# Patient Record
Sex: Female | Born: 1980 | Race: White | Hispanic: No | Marital: Married | Smoking: Former smoker
Health system: Southern US, Community
[De-identification: ages and names within clinical notes are randomized; demographics above are authoritative.]

## PROBLEM LIST (undated history)

## (undated) DIAGNOSIS — F419 Anxiety disorder, unspecified: Secondary | ICD-10-CM

## (undated) DIAGNOSIS — R51 Headache: Secondary | ICD-10-CM

## (undated) DIAGNOSIS — R519 Headache, unspecified: Secondary | ICD-10-CM

## (undated) DIAGNOSIS — F909 Attention-deficit hyperactivity disorder, unspecified type: Secondary | ICD-10-CM

## (undated) DIAGNOSIS — M549 Dorsalgia, unspecified: Secondary | ICD-10-CM

## (undated) DIAGNOSIS — G8929 Other chronic pain: Secondary | ICD-10-CM

## (undated) DIAGNOSIS — M542 Cervicalgia: Secondary | ICD-10-CM

## (undated) HISTORY — PX: TUBAL LIGATION: SHX77

---

## 2008-01-26 ENCOUNTER — Emergency Department (HOSPITAL_COMMUNITY): Admission: EM | Admit: 2008-01-26 | Discharge: 2008-01-26 | Payer: Self-pay | Admitting: Emergency Medicine

## 2011-06-06 ENCOUNTER — Emergency Department (HOSPITAL_COMMUNITY)
Admission: EM | Admit: 2011-06-06 | Discharge: 2011-06-06 | Disposition: A | Discharge status: Home / Self Care | Payer: Medicaid Other | Attending: Emergency Medicine | Admitting: Emergency Medicine

## 2011-06-06 ENCOUNTER — Encounter (HOSPITAL_COMMUNITY): Payer: Self-pay | Admitting: *Deleted

## 2011-06-06 DIAGNOSIS — N390 Urinary tract infection, site not specified: Secondary | ICD-10-CM | POA: Insufficient documentation

## 2011-06-06 DIAGNOSIS — Z9851 Tubal ligation status: Secondary | ICD-10-CM | POA: Insufficient documentation

## 2011-06-06 DIAGNOSIS — F17200 Nicotine dependence, unspecified, uncomplicated: Secondary | ICD-10-CM | POA: Insufficient documentation

## 2011-06-06 LAB — URINE MICROSCOPIC-ADD ON

## 2011-06-06 LAB — URINALYSIS, ROUTINE W REFLEX MICROSCOPIC
Ketones, ur: NEGATIVE mg/dL
Leukocytes, UA: NEGATIVE
Nitrite: NEGATIVE
Protein, ur: NEGATIVE mg/dL
pH: 6 (ref 5.0–8.0)

## 2011-06-06 MED ORDER — NITROFURANTOIN MONOHYD MACRO 100 MG PO CAPS: 100.0000 mg | ORAL_CAPSULE | Freq: Two times a day (BID) | ORAL | Status: AC

## 2011-06-06 MED ORDER — PHENAZOPYRIDINE HCL 100 MG PO TABS
200.0000 mg | ORAL_TABLET | Freq: Once | ORAL | Status: AC
  Administered 2011-06-06: 100 mg via ORAL
  Filled 2011-06-06: qty 1

## 2011-06-06 MED ORDER — NITROFURANTOIN MONOHYD MACRO 100 MG PO CAPS
100.0000 mg | ORAL_CAPSULE | Freq: Once | ORAL | Status: AC
  Administered 2011-06-06: 100 mg via ORAL
  Filled 2011-06-06: qty 1

## 2011-06-06 MED ORDER — PHENAZOPYRIDINE HCL 100 MG PO TABS
ORAL_TABLET | ORAL | Status: AC
  Filled 2011-06-06: qty 1

## 2011-06-06 MED ORDER — PHENAZOPYRIDINE HCL 200 MG PO TABS: 200.0000 mg | ORAL_TABLET | Freq: Three times a day (TID) | ORAL | Status: AC

## 2011-06-06 NOTE — ED Notes (Signed)
Pt c/o burning and pain on urination since last night. Pt also c/o blood when she wipes and urgency.

## 2011-06-06 NOTE — ED Provider Notes (Signed)
History     CSN: [id number]  Arrival date & time 06/06/11  1304   First MD Initiated Contact with Patient 06/06/11 1337      Chief Complaint  Patient presents with  . Urinary Frequency    (Consider location/radiation/quality/duration/timing/severity/associated sxs/prior treatment) Patient is a 31 y.o. female presenting with frequency. The history is provided by the patient. No language interpreter was used.  Urinary Frequency This is a new problem. The current episode started yesterday. The problem occurs constantly. The problem has been gradually worsening. Associated symptoms include urinary symptoms. Pertinent negatives include no chills, diaphoresis, fever, myalgias, nausea or vomiting.    History reviewed. No pertinent past medical history.  Past Surgical History  Procedure Date  . Tubal ligation     History reviewed. No pertinent family history.  History  Substance Use Topics  . Smoking status: Current Everyday Smoker    Types: Cigarettes  . Smokeless tobacco: Not on file  . Alcohol Use: No    OB History    Grav Para Term Preterm Abortions TAB SAB Ect Mult Living                  Review of Systems  Constitutional: Negative for fever, chills and diaphoresis.  Gastrointestinal: Negative for nausea and vomiting.  Genitourinary: Positive for dysuria, urgency, frequency and hematuria.  Musculoskeletal: Negative for myalgias and back pain.  All other systems reviewed and are negative.    Allergies  Ultram  Home Medications   Current Outpatient Rx  Name Route Sig Dispense Refill  . ACETAMINOPHEN 500 MG PO TABS Oral Take 1,000 mg by mouth every 6 (six) hours as needed. For pain    . ALPRAZOLAM 1 MG PO TABS Oral Take 1 mg by mouth 2 (two) times daily as needed. For anxiety    . IBUPROFEN 200 MG PO TABS Oral Take 800 mg by mouth every 6 (six) hours as needed. For pain    . OXYCODONE HCL 15 MG PO TABS Oral Take 15 mg by mouth every 6 (six) hours as needed. For  pain      BP 111/69  Pulse 57  Temp(Src) 98.1 F (36.7 C) (Oral)  Resp 18  Ht 5\' 11"  (1.803 m)  Wt 190 lb (86.183 kg)  BMI 26.50 kg/m2  SpO2 100%  LMP 05/29/2011  Physical Exam  Nursing note and vitals reviewed. Constitutional: She is oriented to person, place, and time. She appears well-developed and well-nourished. No distress.  HENT:  Head: Normocephalic and atraumatic.  Eyes: EOM are normal.  Neck: Normal range of motion.  Cardiovascular: Normal rate, regular rhythm and normal heart sounds.   Pulmonary/Chest: Effort normal and breath sounds normal.  Abdominal: Soft. She exhibits no distension and no mass. There is tenderness. There is no rebound and no guarding.  Genitourinary:       Tenderness in suprapubic region.  No CVAT.  Musculoskeletal: Normal range of motion.  Neurological: She is alert and oriented to person, place, and time.  Skin: Skin is warm and dry.  Psychiatric: She has a normal mood and affect. Judgment normal.    ED Course  Procedures (including critical care time)  Labs Reviewed  URINALYSIS, ROUTINE W REFLEX MICROSCOPIC - Abnormal; Notable for the following:    Color, Urine STRAW (*)    Specific Gravity, Urine <1.005 (*)    Hgb urine dipstick LARGE (*)    All other components within normal limits  URINE MICROSCOPIC-ADD ON - Abnormal; Notable for the  following:    Squamous Epithelial / LPF MANY (*)    All other components within normal limits   No results found.   No diagnosis found.    MDM          Worthy Rancher, PA 06/06/11 1426

## 2011-06-07 LAB — URINE CULTURE
Colony Count: 5000
Culture  Setup Time: 201301291551

## 2011-06-10 NOTE — ED Provider Notes (Signed)
Medical screening examination/treatment/procedure(s) were performed by non-physician practitioner and as supervising physician I was immediately available for consultation/collaboration.   Shelda Jakes, MD 06/10/11 [phone]

## 2012-08-31 ENCOUNTER — Encounter (HOSPITAL_COMMUNITY): Payer: Self-pay | Admitting: *Deleted

## 2012-08-31 ENCOUNTER — Emergency Department (HOSPITAL_COMMUNITY)
Admission: EM | Admit: 2012-08-31 | Discharge: 2012-08-31 | Disposition: A | Discharge status: Home / Self Care | Payer: Medicaid Other | Attending: Emergency Medicine | Admitting: Emergency Medicine

## 2012-08-31 DIAGNOSIS — K029 Dental caries, unspecified: Secondary | ICD-10-CM | POA: Insufficient documentation

## 2012-08-31 DIAGNOSIS — K089 Disorder of teeth and supporting structures, unspecified: Secondary | ICD-10-CM | POA: Insufficient documentation

## 2012-08-31 DIAGNOSIS — F17200 Nicotine dependence, unspecified, uncomplicated: Secondary | ICD-10-CM | POA: Insufficient documentation

## 2012-08-31 DIAGNOSIS — K05219 Aggressive periodontitis, localized, unspecified severity: Secondary | ICD-10-CM

## 2012-08-31 DIAGNOSIS — K0520 Aggressive periodontitis, unspecified: Secondary | ICD-10-CM | POA: Insufficient documentation

## 2012-08-31 DIAGNOSIS — K0889 Other specified disorders of teeth and supporting structures: Secondary | ICD-10-CM

## 2012-08-31 MED ORDER — LIDOCAINE-EPINEPHRINE (PF) 1 %-1:200000 IJ SOLN
INTRAMUSCULAR | Status: AC
  Administered 2012-08-31: 22:00:00
  Filled 2012-08-31: qty 10

## 2012-08-31 MED ORDER — OXYCODONE-ACETAMINOPHEN 5-325 MG PO TABS: 1.0000 | ORAL_TABLET | ORAL | Status: DC | PRN

## 2012-08-31 MED ORDER — PENICILLIN V POTASSIUM 500 MG PO TABS: 500.0000 mg | ORAL_TABLET | Freq: Three times a day (TID) | ORAL | Status: DC

## 2012-08-31 MED ORDER — PENICILLIN V POTASSIUM 250 MG PO TABS
500.0000 mg | ORAL_TABLET | Freq: Once | ORAL | Status: AC
  Administered 2012-08-31: 500 mg via ORAL
  Filled 2012-08-31: qty 2

## 2012-08-31 MED ORDER — OXYCODONE-ACETAMINOPHEN 5-325 MG PO TABS
2.0000 | ORAL_TABLET | Freq: Once | ORAL | Status: AC
  Administered 2012-08-31: 2 via ORAL
  Filled 2012-08-31: qty 2

## 2012-08-31 NOTE — ED Notes (Signed)
MD at bedside. Dr.Ray  

## 2012-08-31 NOTE — ED Notes (Signed)
MD at bedside. 

## 2012-08-31 NOTE — ED Notes (Signed)
Pt reporting crown had broken, and now has an abscess on right lower side.

## 2012-08-31 NOTE — ED Provider Notes (Addendum)
History    This chart was scribed for Hilario Quarry, MD scribed by Magnus Sinning. The patient was seen in room [facility] at 21:58    CSN: [id number]  Arrival date & time 08/31/12  2143     Chief Complaint  Patient presents with  . Dental Pain    (Consider location/radiation/quality/duration/timing/severity/associated sxs/prior treatment) Patient is a 32 y.o. female presenting with tooth pain. The history is provided by the patient. No language interpreter was used.  Dental Pain  Amy Schroeder is a 32 y.o. female who presents to the Emergency Department complaining of constant moderate dental pain  to right lower side that radiates into right ear onset this morning that pt describes as feeling that "pain is in the gum." She says that she has taken ibuprofen every six hours with no relief and that a crown had broken and she suspects that she has a abscess. Pt is unsure of purulent drainage, but reports when she holds her head back she has "weird" taste in mouth. Pt says she is otherwise in healthy medical condition.   History reviewed. No pertinent past medical history.  Past Surgical History  Procedure Laterality Date  . Tubal ligation      History reviewed. No pertinent family history.  History  Substance Use Topics  . Smoking status: Current Every Day Smoker    Types: Cigarettes  . Smokeless tobacco: Not on file  . Alcohol Use: No   Review of Systems  HENT: Positive for dental problem.     Allergies  Ultram  Home Medications   Current Outpatient Rx  Name  Route  Sig  Dispense  Refill  . acetaminophen (TYLENOL) 500 MG tablet   Oral   Take 1,000 mg by mouth every 6 (six) hours as needed. For pain         . ALPRAZolam (XANAX) 1 MG tablet   Oral   Take 1 mg by mouth 2 (two) times daily as needed. For anxiety         . ibuprofen (ADVIL,MOTRIN) 200 MG tablet   Oral   Take 800 mg by mouth every 6 (six) hours as needed. For pain         . oxyCODONE  (ROXICODONE) 15 MG immediate release tablet   Oral   Take 15 mg by mouth every 6 (six) hours as needed. For pain           BP 120/61  Pulse 72  Temp(Src) 98.5 F (36.9 C) (Oral)  Resp 20  Ht 5\' 11"  (1.803 m)  Wt 190 lb (86.183 kg)  BMI 26.51 kg/m2  SpO2 100%  LMP 08/03/2012  Physical Exam  Nursing note and vitals reviewed. Constitutional: She is oriented to person, place, and time. She appears well-developed and well-nourished. No distress.  HENT:  Head: Normocephalic and atraumatic.  1st and 2nd molars (involving teeth 30 and 31) have multiple caries. Small pustular pocket at second molar.   Eyes: Conjunctivae and EOM are normal.  Neck: Neck supple. No tracheal deviation present.  Cardiovascular: Normal rate.   Pulmonary/Chest: Effort normal. No respiratory distress.  Abdominal: She exhibits no distension.  Musculoskeletal: Normal range of motion.  Neurological: She is alert and oriented to person, place, and time. No sensory deficit.  Skin: Skin is dry.  Psychiatric: She has a normal mood and affect. Her behavior is normal.    ED Course  Dental Date/Time: 08/31/2012 10:37 PM Performed by: Hilario Quarry Authorized by: Margarita Grizzle  S Risks and benefits: risks, benefits and alternatives were discussed Consent given by: patient Patient identity confirmed: verbally with patient Time out: Immediately prior to procedure a "time out" was called to verify the correct patient, procedure, equipment, support staff and site/side marked as required. Preparation: Patient was prepped and draped in the usual sterile fashion. Local anesthesia used: yes Anesthesia: local infiltration and nerve block Local anesthetic: lidocaine 1% with epinephrine Anesthetic total: 4 ml Patient sedated: no Patient tolerance: Patient tolerated the procedure well with no immediate complications. Comments: Local abscess below 2nd right inferior molar i and d with small amount of pus Right mandibular  block placed. Cotton packing placed over mucosa.    (including critical care time) DIAGNOSTIC STUDIES: Oxygen Saturation is 100% on room air, nomra by my interpretation.    COORDINATION OF CARE: 21:59: Physical exam performed. 22:12:Provided intent to d/c with pain medications with recommendations for follow-up.   INCISION AND DRAINAGE PROCEDURE NOTE: Patient identification was confirmed and verbal consent was obtained. This procedure was performed by Hilario Quarry, MD at 10:09 PM. Site: right second inferior molar  Sterile procedures observeddental  Needle size: 22 Anesthetic used (type and amt): Lidocaine with epinephrine  Blade size: 11 Drainage: pus Complexity: simple Packing usednone Site anesthetized, incision made over site, wound drained and explored loculations, rinsed with copious amounts of normal saline, wound packed with sterile gauze, covered with dry, sterile dressing.  Pt tolerated procedure well without complications.  Instructions for care discussed verbally and pt provided with additional written instructions for homecare and f/u.  Labs Reviewed - No data to display No results found.   No diagnosis found.    MDM  Patient placed on percocet and penicillin and given community resource guide.     I personally performed the services described in this documentation, which was scribed in my presence. The recorded information has been reviewed and considered.     Hilario Quarry, MD 08/31/12 [id number]  Hilario Quarry, MD 08/31/12 2240

## 2012-09-16 ENCOUNTER — Emergency Department (HOSPITAL_COMMUNITY)
Admission: EM | Admit: 2012-09-16 | Discharge: 2012-09-17 | Disposition: A | Discharge status: Home / Self Care | Payer: Medicaid Other | Attending: Emergency Medicine | Admitting: Emergency Medicine

## 2012-09-16 DIAGNOSIS — R1013 Epigastric pain: Secondary | ICD-10-CM

## 2012-09-16 DIAGNOSIS — R197 Diarrhea, unspecified: Secondary | ICD-10-CM | POA: Insufficient documentation

## 2012-09-16 DIAGNOSIS — Z79899 Other long term (current) drug therapy: Secondary | ICD-10-CM | POA: Insufficient documentation

## 2012-09-16 DIAGNOSIS — Z3202 Encounter for pregnancy test, result negative: Secondary | ICD-10-CM | POA: Insufficient documentation

## 2012-09-16 DIAGNOSIS — K219 Gastro-esophageal reflux disease without esophagitis: Secondary | ICD-10-CM | POA: Insufficient documentation

## 2012-09-16 DIAGNOSIS — Z9851 Tubal ligation status: Secondary | ICD-10-CM | POA: Insufficient documentation

## 2012-09-16 DIAGNOSIS — R110 Nausea: Secondary | ICD-10-CM | POA: Insufficient documentation

## 2012-09-16 DIAGNOSIS — F17200 Nicotine dependence, unspecified, uncomplicated: Secondary | ICD-10-CM | POA: Insufficient documentation

## 2012-09-16 LAB — POCT PREGNANCY, URINE: Preg Test, Ur: NEGATIVE

## 2012-09-16 MED ORDER — GI COCKTAIL ~~LOC~~
30.0000 mL | Freq: Once | ORAL | Status: AC
  Administered 2012-09-16: 30 mL via ORAL
  Filled 2012-09-16: qty 30

## 2012-09-16 MED ORDER — FAMOTIDINE 20 MG PO TABS
20.0000 mg | ORAL_TABLET | Freq: Once | ORAL | Status: AC
  Administered 2012-09-16: 20 mg via ORAL
  Filled 2012-09-16: qty 1

## 2012-09-16 MED ORDER — PANTOPRAZOLE SODIUM 40 MG PO TBEC
40.0000 mg | DELAYED_RELEASE_TABLET | Freq: Once | ORAL | Status: AC
  Administered 2012-09-16: 40 mg via ORAL
  Filled 2012-09-16: qty 1

## 2012-09-16 NOTE — ED Notes (Signed)
States she has had abdominal pain for the past 3 days, history of same

## 2012-09-16 NOTE — ED Provider Notes (Signed)
History    This chart was scribed for Amy Nielsen, MD by Leone Payor, ED Scribe. This patient was seen in room APA06/[facility]06 and the patient's care was started 10:58 PM.   CSN: [id number]  Arrival date & time 09/16/12  2247   First MD Initiated Contact with Patient 09/16/12 2256      Chief Complaint  Patient presents with  . Abdominal Pain     The history is provided by the patient. No language interpreter was used.    HPI Comments: Amy Schroeder is a 32 y.o. female who presents to the Emergency Department complaining of constant, gradually worsening, sharp stomach pain to epigastric abdominal pain starting 3 days ago. Rates it as 9/10 currently. Pt reports having a h/o a stomach ulcer that comes and goes since childhood. She has associated diarrhea, nausea, associated reflux. She has taken Zantac with mild relief. She drinks milk with relief. She denies vomiting, bloody stools, fever. LNMP less than 1 week ago. Pt reports taking motrin. She denies drinking alcohol.   No past medical history on file.  Past Surgical History  Procedure Laterality Date  . Tubal ligation      No family history on file.  History  Substance Use Topics  . Smoking status: Current Every Day Smoker    Types: Cigarettes  . Smokeless tobacco: Not on file  . Alcohol Use: No    OB History   Grav Para Term Preterm Abortions TAB SAB Ect Mult Living                  Review of Systems  Constitutional: Negative for fever.  Gastrointestinal: Positive for nausea, abdominal pain and diarrhea. Negative for vomiting and blood in stool.  All other systems reviewed and are negative.    Allergies  Ultram  Home Medications   Current Outpatient Rx  Name  Route  Sig  Dispense  Refill  . acetaminophen (TYLENOL) 500 MG tablet   Oral   Take 1,000 mg by mouth every 6 (six) hours as needed. For pain         . ALPRAZolam (XANAX) 1 MG tablet   Oral   Take 1 mg by mouth 2 (two) times daily as needed. For  anxiety         . ibuprofen (ADVIL,MOTRIN) 200 MG tablet   Oral   Take 800 mg by mouth every 6 (six) hours as needed. For pain         . oxyCODONE (ROXICODONE) 15 MG immediate release tablet   Oral   Take 15 mg by mouth every 6 (six) hours as needed. For pain         . oxyCODONE-acetaminophen (PERCOCET/ROXICET) 5-325 MG per tablet   Oral   Take 1 tablet by mouth every 4 (four) hours as needed for pain.   6 tablet   0   . penicillin v potassium (VEETID) 500 MG tablet   Oral   Take 1 tablet (500 mg total) by mouth 3 (three) times daily.   30 tablet   0     BP 119/72  Pulse 90  Temp(Src) 97.6 F (36.4 C) (Oral)  Resp 20  Ht 5\' 11"  (1.803 m)  Wt 185 lb (83.915 kg)  BMI 25.81 kg/m2  SpO2 100%  LMP 09/11/2012  Physical Exam  Nursing note and vitals reviewed. Constitutional: She is oriented to person, place, and time. She appears well-developed and well-nourished. No distress.  HENT:  Head: Normocephalic and atraumatic.  Mouth/Throat: Oropharynx is clear and moist. No oropharyngeal exudate.  Eyes: EOM are normal.  Neck: Neck supple. No tracheal deviation present.  Cardiovascular: Normal rate.   Pulmonary/Chest: Effort normal. No respiratory distress.  Abdominal: There is tenderness. There is negative Murphy's sign.  Tender epigastric region. No RUQ tenderness.  Genitourinary:  No CVA tenderness.   Musculoskeletal: Normal range of motion.  Neurological: She is alert and oriented to person, place, and time.  Skin: Skin is warm and dry.  Psychiatric: She has a normal mood and affect. Her behavior is normal.    ED Course  Procedures (including critical care time)  DIAGNOSTIC STUDIES: Oxygen Saturation is 100% on room air, normal  by my interpretation.    COORDINATION OF CARE: 11:10 PM Discussed treatment plan with pt at bedside and pt agreed to plan.   Results for orders placed during the hospital encounter of 09/16/12  POCT PREGNANCY, URINE      Result  Value Range   Preg Test, Ur NEGATIVE  NEGATIVE   Pepcid, gi cocktail, protonix and IM Dilaudid  MDM  Epigastric pain and clinical gastritis, no RUQ pain/ tenderness and neg Murphys sign doubt GB etiology Improved with Im narcotics and medications as above VS and nursing notes reviewed and considered   I personally performed the services described in this documentation, which was scribed in my presence. The recorded information has been reviewed and is accurate.    Amy Nielsen, MD 09/17/12 [phone]

## 2012-09-17 ENCOUNTER — Encounter (HOSPITAL_COMMUNITY): Payer: Self-pay

## 2012-09-17 MED ORDER — HYDROMORPHONE HCL PF 1 MG/ML IJ SOLN
1.0000 mg | Freq: Once | INTRAMUSCULAR | Status: AC
  Administered 2012-09-17: 1 mg via INTRAMUSCULAR
  Filled 2012-09-17: qty 1

## 2012-09-17 MED ORDER — FAMOTIDINE 20 MG PO TABS: 20.0000 mg | ORAL_TABLET | Freq: Two times a day (BID) | ORAL | Status: DC

## 2012-09-17 MED ORDER — SUCRALFATE 1 G PO TABS: 1.0000 g | ORAL_TABLET | Freq: Four times a day (QID) | ORAL | Status: DC

## 2012-09-17 MED ORDER — PANTOPRAZOLE SODIUM 20 MG PO TBEC: 20.0000 mg | DELAYED_RELEASE_TABLET | Freq: Every day | ORAL | Status: DC

## 2012-09-17 NOTE — ED Notes (Signed)
Patient states that she is still having abdominal pain. Notified Dr. Dierdre Highman. Patient also asked for something to drink. MD states she can have water. MD will be going to speak with patient.

## 2012-09-18 ENCOUNTER — Encounter (HOSPITAL_COMMUNITY): Payer: Self-pay | Admitting: *Deleted

## 2012-09-18 ENCOUNTER — Emergency Department (HOSPITAL_COMMUNITY)
Admission: EM | Admit: 2012-09-18 | Discharge: 2012-09-18 | Disposition: A | Discharge status: Home / Self Care | Payer: Medicaid Other | Attending: Emergency Medicine | Admitting: Emergency Medicine

## 2012-09-18 DIAGNOSIS — W260XXA Contact with knife, initial encounter: Secondary | ICD-10-CM | POA: Insufficient documentation

## 2012-09-18 DIAGNOSIS — S61412A Laceration without foreign body of left hand, initial encounter: Secondary | ICD-10-CM

## 2012-09-18 DIAGNOSIS — Y929 Unspecified place or not applicable: Secondary | ICD-10-CM | POA: Insufficient documentation

## 2012-09-18 DIAGNOSIS — Y93G1 Activity, food preparation and clean up: Secondary | ICD-10-CM | POA: Insufficient documentation

## 2012-09-18 DIAGNOSIS — S61409A Unspecified open wound of unspecified hand, initial encounter: Secondary | ICD-10-CM | POA: Insufficient documentation

## 2012-09-18 DIAGNOSIS — F17200 Nicotine dependence, unspecified, uncomplicated: Secondary | ICD-10-CM | POA: Insufficient documentation

## 2012-09-18 DIAGNOSIS — Z23 Encounter for immunization: Secondary | ICD-10-CM | POA: Insufficient documentation

## 2012-09-18 DIAGNOSIS — Z79899 Other long term (current) drug therapy: Secondary | ICD-10-CM | POA: Insufficient documentation

## 2012-09-18 MED ORDER — TETANUS-DIPHTH-ACELL PERTUSSIS 5-2.5-18.5 LF-MCG/0.5 IM SUSP
0.5000 mL | Freq: Once | INTRAMUSCULAR | Status: AC
  Administered 2012-09-18: 0.5 mL via INTRAMUSCULAR

## 2012-09-18 MED ORDER — DOUBLE ANTIBIOTIC 500-10000 UNIT/GM EX OINT
TOPICAL_OINTMENT | Freq: Once | CUTANEOUS | Status: AC
  Administered 2012-09-18: 01:00:00 via TOPICAL

## 2012-09-18 NOTE — ED Provider Notes (Signed)
History     CSN: [id number]  Arrival date & time 09/18/12  0020   First MD Initiated Contact with Patient 09/18/12 0239      Chief Complaint  Patient presents with  . Laceration    (Consider location/radiation/quality/duration/timing/severity/associated sxs/prior treatment) HPI Comments: 32 year old female who presents with a complaint of a laceration to her left ulnar palm.  She states that while she was doing dishes she accidentally sustained a laceration to her left hand with a knife in the dish water cut her.  This was acute in onset at approximately 7:00 PM on 09/17/2012.  The pain has been persistent, worse with palpation and associated with bleeding which has had some difficulty stopping.  She has applied pressure and a dressing but still has small amount of bleeding.  There is no numbness or weakness to her hand.  She is unsure of her last tetanus shot  Patient is a 32 y.o. female presenting with skin laceration. The history is provided by the patient and a friend.  Laceration   History reviewed. No pertinent past medical history.  Past Surgical History  Procedure Laterality Date  . Tubal ligation      History reviewed. No pertinent family history.  History  Substance Use Topics  . Smoking status: Current Every Day Smoker    Types: Cigarettes  . Smokeless tobacco: Not on file  . Alcohol Use: No    OB History   Grav Para Term Preterm Abortions TAB SAB Ect Mult Living                  Review of Systems  Constitutional: Negative for fever.  Gastrointestinal: Negative for vomiting.  Skin: Positive for wound.       Laceration  Neurological: Negative for weakness and numbness.    Allergies  Ultram  Home Medications   Current Outpatient Rx  Name  Route  Sig  Dispense  Refill  . acetaminophen (TYLENOL) 500 MG tablet   Oral   Take 1,000 mg by mouth every 6 (six) hours as needed. For pain         . ALPRAZolam (XANAX) 1 MG tablet   Oral   Take 1 mg by  mouth 2 (two) times daily as needed. For anxiety         . cimetidine (TAGAMET HB) 200 MG tablet   Oral   Take 200 mg by mouth daily as needed.         . famotidine (PEPCID) 20 MG tablet   Oral   Take 1 tablet (20 mg total) by mouth 2 (two) times daily.   30 tablet   0   . ibuprofen (ADVIL,MOTRIN) 200 MG tablet   Oral   Take 800 mg by mouth every 6 (six) hours as needed. For pain         . pantoprazole (PROTONIX) 20 MG tablet   Oral   Take 1 tablet (20 mg total) by mouth daily.   30 tablet   0   . ranitidine (ZANTAC) 75 MG tablet   Oral   Take 75-150 mg by mouth daily as needed for heartburn.         . sucralfate (CARAFATE) 1 G tablet   Oral   Take 1 tablet (1 g total) by mouth 4 (four) times daily.   30 tablet   0     LMP 09/11/2012  Physical Exam  Constitutional: She appears well-developed and well-nourished. No distress.  HENT:  Head: Normocephalic.  Eyes: Conjunctivae are normal. No scleral icterus.  Cardiovascular: Normal rate and regular rhythm.   Pulmonary/Chest: Effort normal and breath sounds normal.  Musculoskeletal: Normal range of motion. She exhibits tenderness ( ttp over the laceration site). She exhibits no edema.  Neurological: She is alert. Coordination normal.  Sensation and motor intact  Skin: Skin is warm and dry. She is not diaphoretic.  Laceration located on left ulnar surface of the palm The Laceration is linear shaped The depth is subcutaneous The length is 2.5 cm    ED Course  Procedures (including critical care time)  Labs Reviewed - No data to display No results found.   1. Laceration of left hand, initial encounter       MDM  Laceration repaired without difficulty, patient stable for discharge, no antibiotics warranted, to go Percocet pack given.  LACERATION REPAIR Performed by: Vida Roller Authorized by: Vida Roller Consent: Verbal consent obtained. Risks and benefits: risks, benefits and alternatives  were discussed Consent given by: patient Patient identity confirmed: provided demographic data Prepped and Draped in normal sterile fashion Wound explored  Laceration Location: Left ulnar palm  Laceration Length: 2.5 cm  No Foreign Bodies seen or palpated  Anesthesia: local infiltration  Local anesthetic: lidocaine 1 % with epinephrine  Anesthetic total: 2 ml  Irrigation method: syringe Amount of cleaning: standard  Skin closure: 4-0 Prolene   Number of sutures: 3   Technique: Simple interrupted   Patient tolerance: Patient tolerated the procedure well with no immediate complications.         Vida Roller, MD 09/18/12 [phone]

## 2012-09-18 NOTE — ED Notes (Signed)
Pt given 3 sutures to left palm.

## 2013-01-17 ENCOUNTER — Encounter (HOSPITAL_COMMUNITY): Payer: Self-pay | Admitting: Emergency Medicine

## 2013-01-17 ENCOUNTER — Emergency Department (HOSPITAL_COMMUNITY)
Admission: EM | Admit: 2013-01-17 | Discharge: 2013-01-17 | Disposition: A | Discharge status: Home / Self Care | Payer: Medicaid Other | Attending: Emergency Medicine | Admitting: Emergency Medicine

## 2013-01-17 DIAGNOSIS — R110 Nausea: Secondary | ICD-10-CM | POA: Insufficient documentation

## 2013-01-17 DIAGNOSIS — J329 Chronic sinusitis, unspecified: Secondary | ICD-10-CM

## 2013-01-17 DIAGNOSIS — R51 Headache: Secondary | ICD-10-CM | POA: Insufficient documentation

## 2013-01-17 DIAGNOSIS — G8929 Other chronic pain: Secondary | ICD-10-CM | POA: Insufficient documentation

## 2013-01-17 DIAGNOSIS — F17200 Nicotine dependence, unspecified, uncomplicated: Secondary | ICD-10-CM | POA: Insufficient documentation

## 2013-01-17 HISTORY — DX: Headache: R51

## 2013-01-17 HISTORY — DX: Headache, unspecified: R51.9

## 2013-01-17 HISTORY — DX: Other chronic pain: G89.29

## 2013-01-17 HISTORY — DX: Dorsalgia, unspecified: M54.9

## 2013-01-17 HISTORY — DX: Cervicalgia: M54.2

## 2013-01-17 MED ORDER — DIAZEPAM 5 MG PO TABS
5.0000 mg | ORAL_TABLET | Freq: Once | ORAL | Status: AC
  Administered 2013-01-17: 5 mg via ORAL
  Filled 2013-01-17: qty 1

## 2013-01-17 MED ORDER — METOCLOPRAMIDE HCL 5 MG/ML IJ SOLN
10.0000 mg | Freq: Once | INTRAMUSCULAR | Status: AC
  Administered 2013-01-17: 10 mg via INTRAMUSCULAR
  Filled 2013-01-17: qty 2

## 2013-01-17 MED ORDER — KETOROLAC TROMETHAMINE 60 MG/2ML IM SOLN
60.0000 mg | Freq: Once | INTRAMUSCULAR | Status: AC
  Administered 2013-01-17: 60 mg via INTRAMUSCULAR
  Filled 2013-01-17: qty 2

## 2013-01-17 MED ORDER — PROMETHAZINE HCL 25 MG PO TABS: 25.0000 mg | ORAL_TABLET | Freq: Four times a day (QID) | ORAL | Status: DC | PRN

## 2013-01-17 MED ORDER — DIPHENHYDRAMINE HCL 50 MG/ML IJ SOLN
50.0000 mg | Freq: Once | INTRAMUSCULAR | Status: AC
  Administered 2013-01-17: 50 mg via INTRAMUSCULAR
  Filled 2013-01-17: qty 1

## 2013-01-17 MED ORDER — HYDROMORPHONE HCL PF 1 MG/ML IJ SOLN
1.0000 mg | Freq: Once | INTRAMUSCULAR | Status: AC
  Administered 2013-01-17: 1 mg via INTRAMUSCULAR
  Filled 2013-01-17: qty 1

## 2013-01-17 NOTE — ED Provider Notes (Signed)
CSN: [id number]     Arrival date & time 01/17/13  2107 History   First MD Initiated Contact with Patient 01/17/13 2129     Chief Complaint  Patient presents with  . Headache  . Nausea    HPI Pt was seen at 2130. Per pt, c/o gradual onset and persistence of constant acute flair of her chronic migraine headache since this morning.  Describes the headache as "throbbing" frontal and bitemporal areas, and per her usual chronic migraine headache pain pattern for many years. Has been associated with nausea. Denies headache was sudden or maximal in onset or at any time.  Denies visual changes, no focal motor weakness, no tingling/numbness in extremities, no fevers, no neck pain, no rash.  Pt also c/o gradual onset and persistence of constant sinus and ears congestion for the past several days. States she "thinks I have allergies." Denies sore throat, no fevers, no rash.    Past Medical History  Diagnosis Date  . Chronic back pain   . Headache   . Chronic pain   . Chronic neck pain    Past Surgical History  Procedure Laterality Date  . Tubal ligation      History  Substance Use Topics  . Smoking status: Current Every Day Smoker    Types: Cigarettes  . Smokeless tobacco: Not on file  . Alcohol Use: No    Review of Systems ROS: Statement: All systems negative except as marked or noted in the HPI; Constitutional: Negative for fever and chills. ; ; Eyes: Negative for eye pain, redness and discharge. ; ; ENMT: Negative for ear pain, hoarseness, sore throat, +nasal congestion, sinus pressure. ; ; Cardiovascular: Negative for chest pain, palpitations, diaphoresis, dyspnea and peripheral edema. ; ; Respiratory: Negative for cough, wheezing and stridor. ; ; Gastrointestinal: +nausea. Negative for vomiting, diarrhea, abdominal pain, blood in stool, hematemesis, jaundice and rectal bleeding. . ; ; Genitourinary: Negative for dysuria, flank pain and hematuria. ; ; Musculoskeletal: Negative for back pain  and neck pain. Negative for swelling and trauma.; ; Skin: Negative for pruritus, rash, abrasions, blisters, bruising and skin lesion.; ; Neuro: +headache. Negative for lightheadedness and neck stiffness. Negative for weakness, altered level of consciousness , altered mental status, extremity weakness, paresthesias, involuntary movement, seizure and syncope.      Allergies  Ultram  Home Medications   Current Outpatient Rx  Name  Route  Sig  Dispense  Refill  . acetaminophen (TYLENOL) 500 MG tablet   Oral   Take 1,000 mg by mouth every 6 (six) hours as needed. For pain         . ibuprofen (ADVIL,MOTRIN) 200 MG tablet   Oral   Take 600 mg by mouth every 6 (six) hours as needed for pain.         . promethazine (PHENERGAN) 25 MG tablet   Oral   Take 1 tablet (25 mg total) by mouth every 6 (six) hours as needed for nausea (or headache).   8 tablet   0    [place] Controlled Substance Database accessed: pt has regular monthly rx of oxycodone 15mg  tabs #150, and xanax 1mg  tabs #90, rx monthly by Phineas Semen, PA with last rx filled 12/19/12 and 01/11/13 respectively. Pt did not report these when asked by ED staff regarding the meds she is currently taking.     BP 118/80  Pulse 60  Temp(Src) 98.9 F (37.2 C) (Oral)  Resp 20  SpO2 98%  LMP 01/13/2013 Physical Exam  2135: Physical examination:  Nursing notes reviewed; Vital signs and O2 SAT reviewed;  Constitutional: Well developed, Well nourished, Well hydrated, In no acute distress; Head:  Normocephalic, atraumatic; Eyes: EOMI, PERRL, No scleral icterus; ENMT: TM's clear bilat. +edemetous nasal turbinates bilat with clear rhinorrhea. +tender to percuss frontal and bilat maxillary sinuses. Mouth and pharynx normal, Mucous membranes moist; Neck: Supple, Full range of motion, No lymphadenopathy; Cardiovascular: Regular rate and rhythm, No murmur, rub, or gallop; Respiratory: Breath sounds clear & equal bilaterally, No rales, rhonchi, wheezes.   Speaking full sentences with ease, Normal respiratory effort/excursion; Chest: Nontender, Movement normal; Abdomen: Soft, Nontender, Nondistended, Normal bowel sounds; Genitourinary: No CVA tenderness; Spine:  No midline CS, TS, LS tenderness.;; Extremities: Pulses normal, No tenderness, No edema, No calf edema or asymmetry.; Neuro: AA&Ox3, Major CN grossly intact. No facial droop. Speech clear. Climbs on and off stretcher easily by herself. Gait steady. No gross focal motor or sensory deficits in extremities.; Skin: Color normal, Warm, Dry.   ED Course  Procedures    MDM  MDM Reviewed: previous chart, nursing note and vitals     2300:  Feels better and wants to go home now. No red flags on HPI or PE.  Will continue to tx headache and sinus congestion symptomatically at this time. Dx d/w pt and family.  Questions answered.  Verb understanding, agreeable to d/c home with outpt f/u.   Laray Anger, DO 01/20/13 [phone]

## 2013-01-17 NOTE — ED Notes (Signed)
Pt c/o severe headache and nausea started today.

## 2014-05-19 ENCOUNTER — Ambulatory Visit (HOSPITAL_COMMUNITY): Payer: Self-pay | Admitting: Psychiatry

## 2014-07-03 ENCOUNTER — Encounter (HOSPITAL_COMMUNITY): Payer: Self-pay | Admitting: *Deleted

## 2014-07-03 ENCOUNTER — Ambulatory Visit (HOSPITAL_COMMUNITY): Payer: Self-pay | Admitting: Psychiatry

## 2016-01-27 ENCOUNTER — Emergency Department (HOSPITAL_COMMUNITY): Payer: Medicaid Other

## 2016-01-27 ENCOUNTER — Encounter (HOSPITAL_COMMUNITY): Payer: Self-pay | Admitting: Cardiology

## 2016-01-27 ENCOUNTER — Emergency Department (HOSPITAL_COMMUNITY)
Admission: EM | Admit: 2016-01-27 | Discharge: 2016-01-27 | Disposition: A | Discharge status: Home / Self Care | Payer: Medicaid Other | Attending: Emergency Medicine | Admitting: Emergency Medicine

## 2016-01-27 DIAGNOSIS — F17210 Nicotine dependence, cigarettes, uncomplicated: Secondary | ICD-10-CM | POA: Diagnosis not present

## 2016-01-27 DIAGNOSIS — Y939 Activity, unspecified: Secondary | ICD-10-CM | POA: Insufficient documentation

## 2016-01-27 DIAGNOSIS — S7002XA Contusion of left hip, initial encounter: Secondary | ICD-10-CM | POA: Diagnosis not present

## 2016-01-27 DIAGNOSIS — W010XXA Fall on same level from slipping, tripping and stumbling without subsequent striking against object, initial encounter: Secondary | ICD-10-CM | POA: Insufficient documentation

## 2016-01-27 DIAGNOSIS — Z79899 Other long term (current) drug therapy: Secondary | ICD-10-CM | POA: Diagnosis not present

## 2016-01-27 DIAGNOSIS — S199XXA Unspecified injury of neck, initial encounter: Secondary | ICD-10-CM | POA: Diagnosis present

## 2016-01-27 DIAGNOSIS — S161XXA Strain of muscle, fascia and tendon at neck level, initial encounter: Secondary | ICD-10-CM | POA: Insufficient documentation

## 2016-01-27 DIAGNOSIS — Y929 Unspecified place or not applicable: Secondary | ICD-10-CM | POA: Insufficient documentation

## 2016-01-27 DIAGNOSIS — W19XXXA Unspecified fall, initial encounter: Secondary | ICD-10-CM

## 2016-01-27 DIAGNOSIS — Z791 Long term (current) use of non-steroidal anti-inflammatories (NSAID): Secondary | ICD-10-CM | POA: Insufficient documentation

## 2016-01-27 DIAGNOSIS — Y990 Civilian activity done for income or pay: Secondary | ICD-10-CM | POA: Diagnosis not present

## 2016-01-27 DIAGNOSIS — M5416 Radiculopathy, lumbar region: Secondary | ICD-10-CM | POA: Insufficient documentation

## 2016-01-27 LAB — POC URINE PREG, ED: Preg Test, Ur: NEGATIVE

## 2016-01-27 IMAGING — DX DG SHOULDER 2+V*L*
3 series · 3 of 3 positions shown · non-contrast
Comparison: None.

CLINICAL DATA: 35 y/o F; patient fell at work twice today with
complaint of the left shoulder pain.

EXAM:
LEFT SHOULDER - 2+ VIEW

[shoulder grashey]
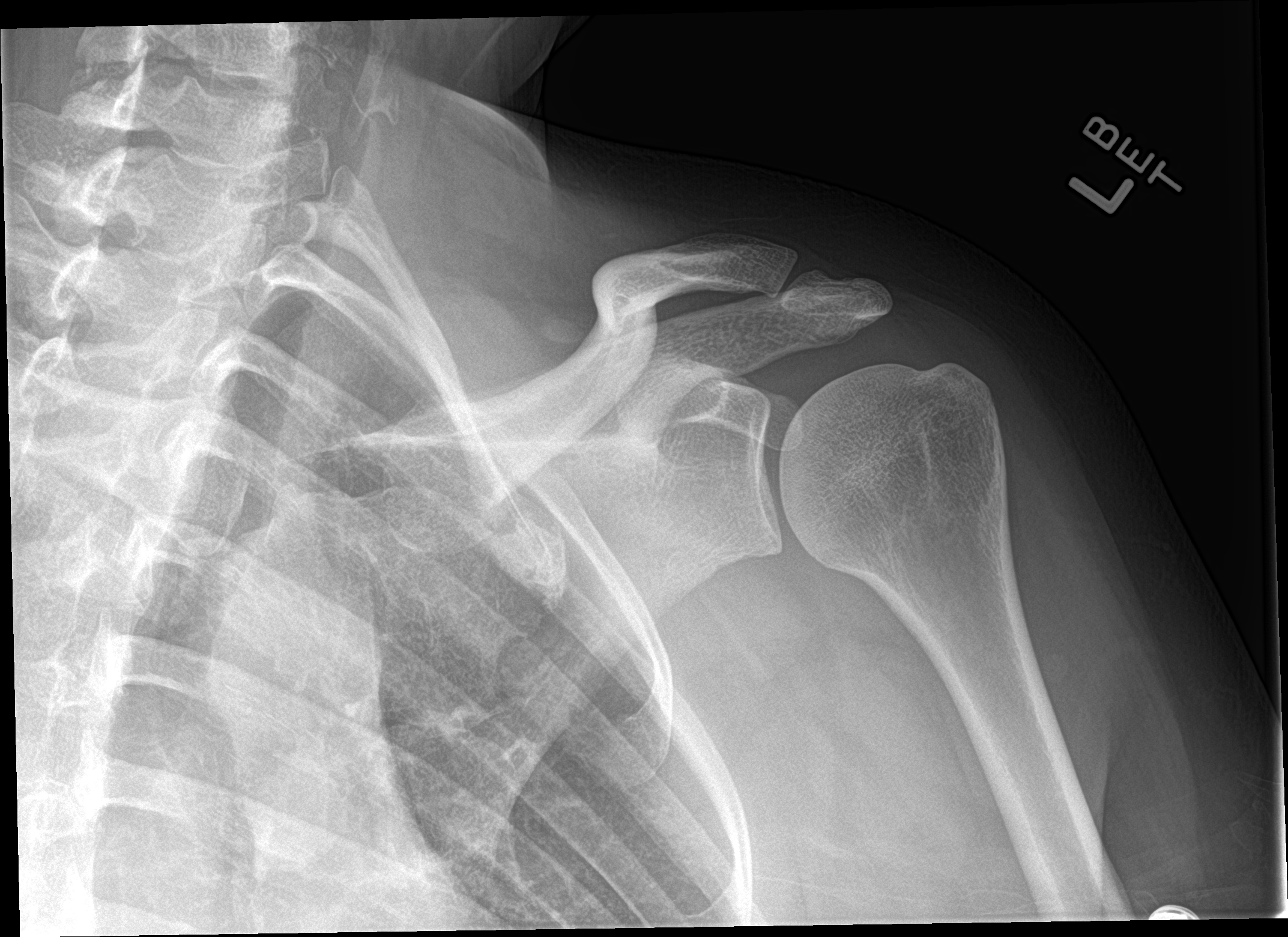

[shoulder y view]
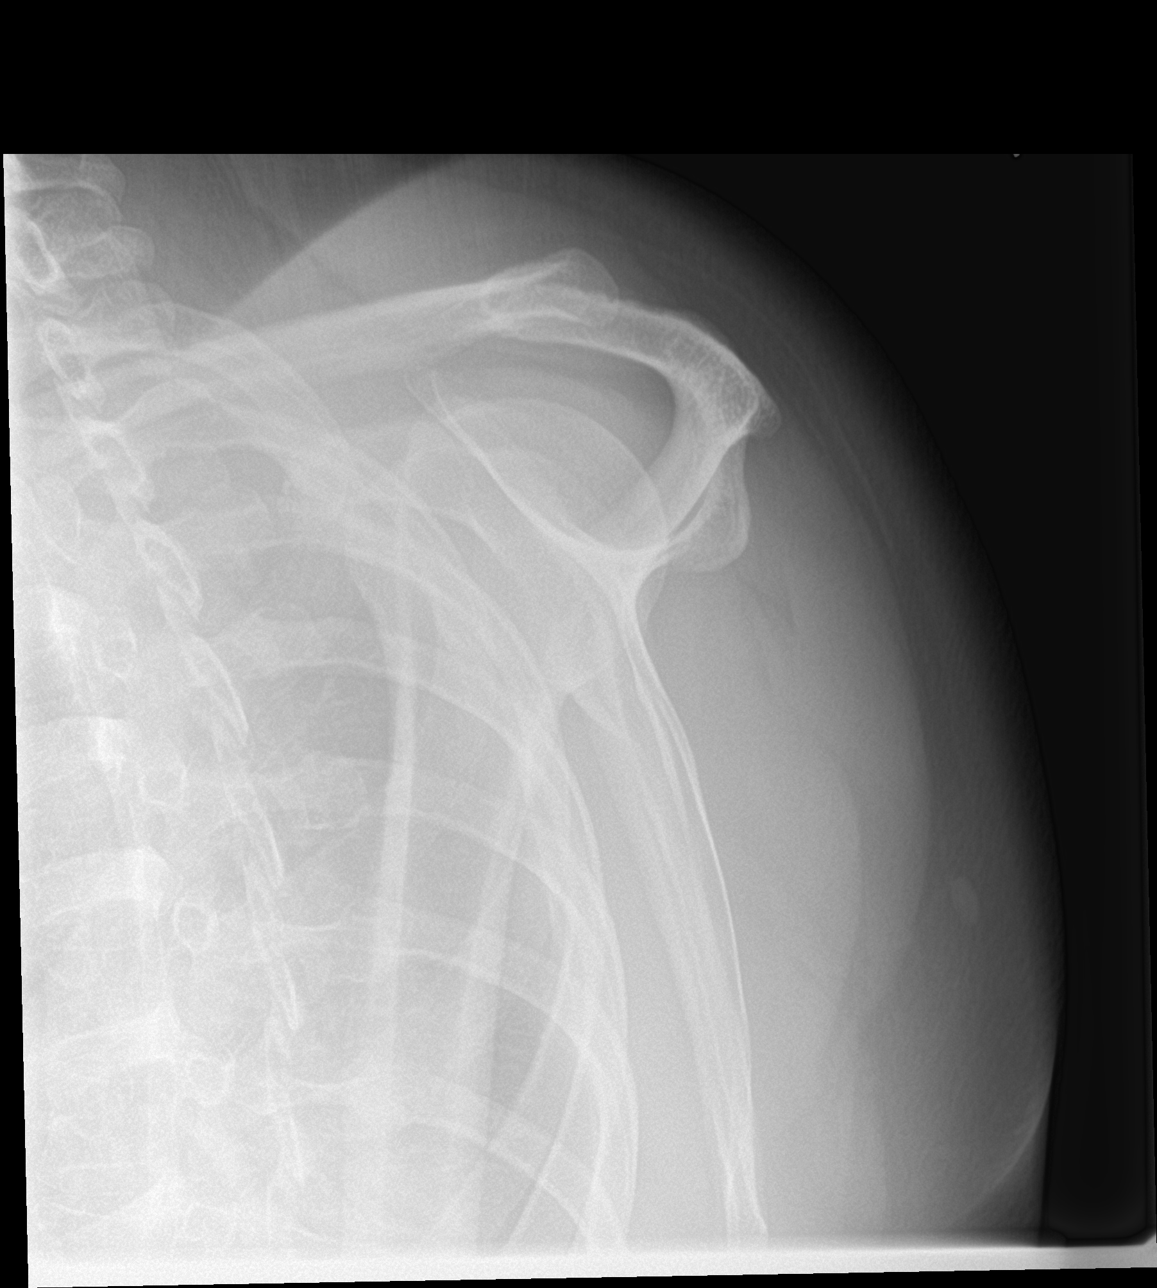

[shoulder axillary]
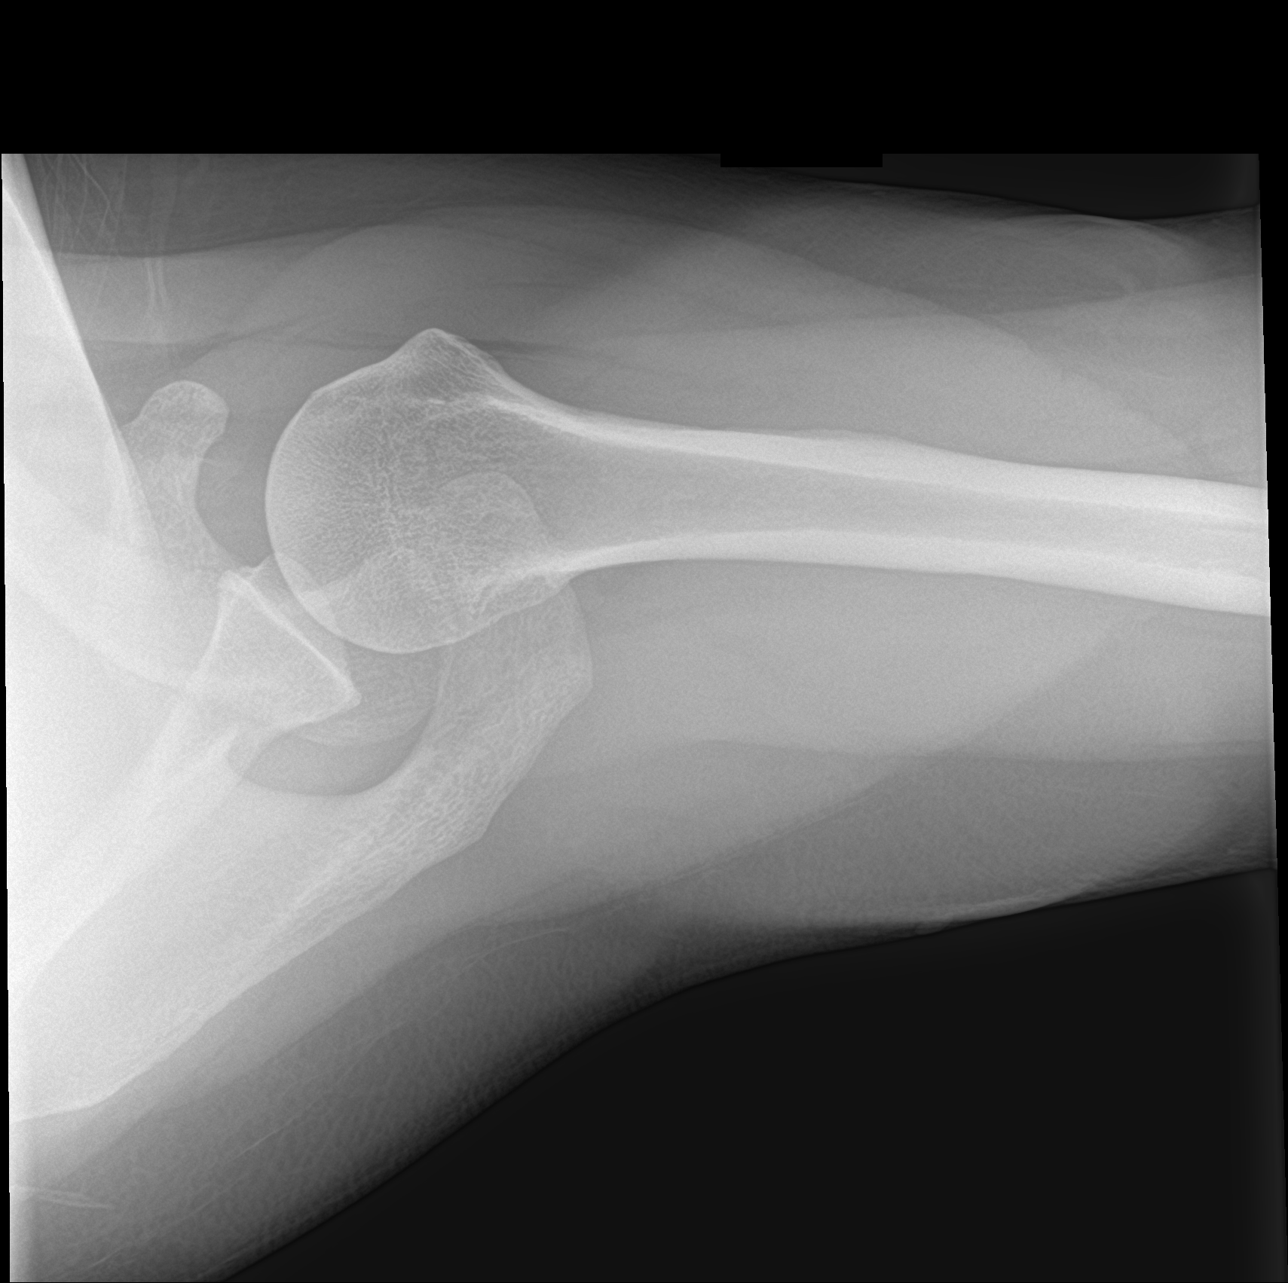

[3 of 3 positions shown; findings below may reference images not displayed]

FINDINGS: There is no evidence of fracture or dislocation. There is no
evidence of arthropathy or other focal bone abnormality. Soft
tissues are unremarkable.
IMPRESSION: Negative.

By: AVA-DAWN M.D.

## 2016-01-27 IMAGING — DX DG LUMBAR SPINE COMPLETE 4+V
5 series · 5 of 5 positions shown · non-contrast
Comparison: Lumbar spine MRI [DATE].

CLINICAL DATA: Patient slipped and fell at work today. Left lower
back pain radiates into the left hip.

EXAM:
LUMBAR SPINE - COMPLETE 4+ VIEW

[l-spine ap]
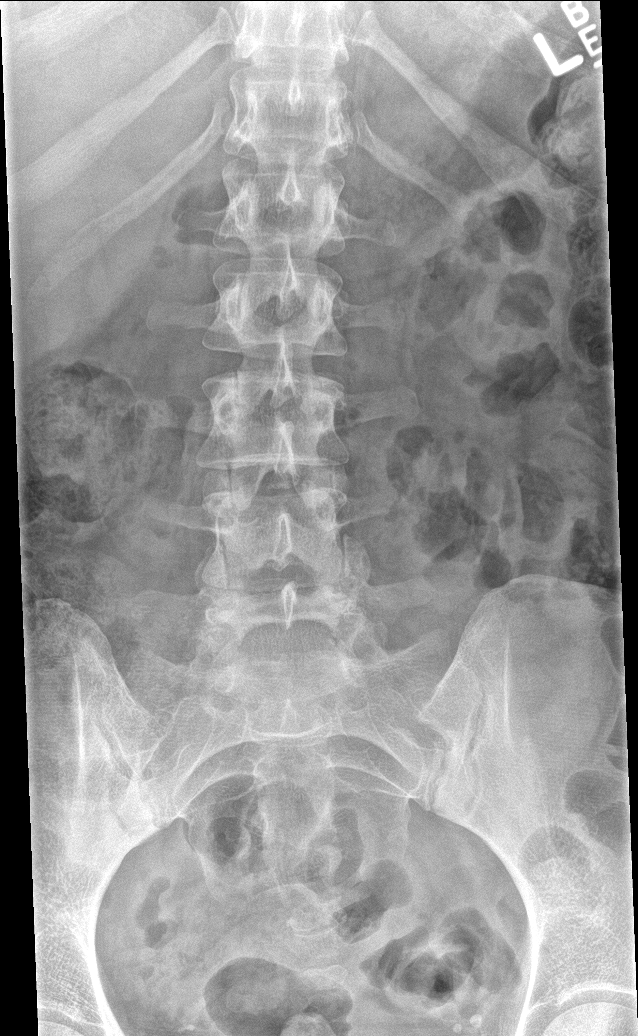

[l-spine obl (1 of 2)]
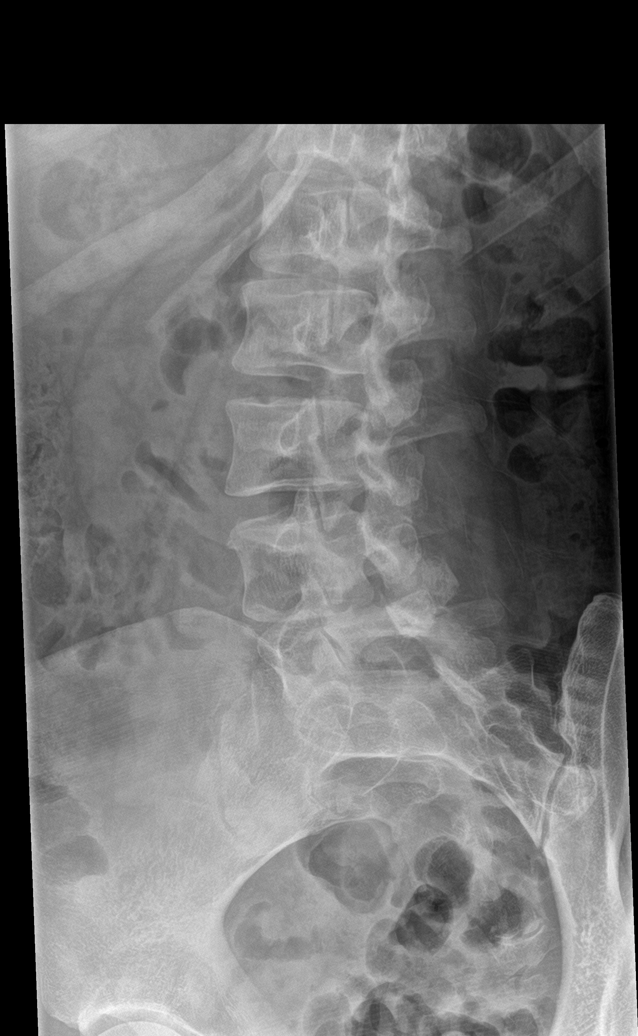

[l-spine obl (2 of 2)]
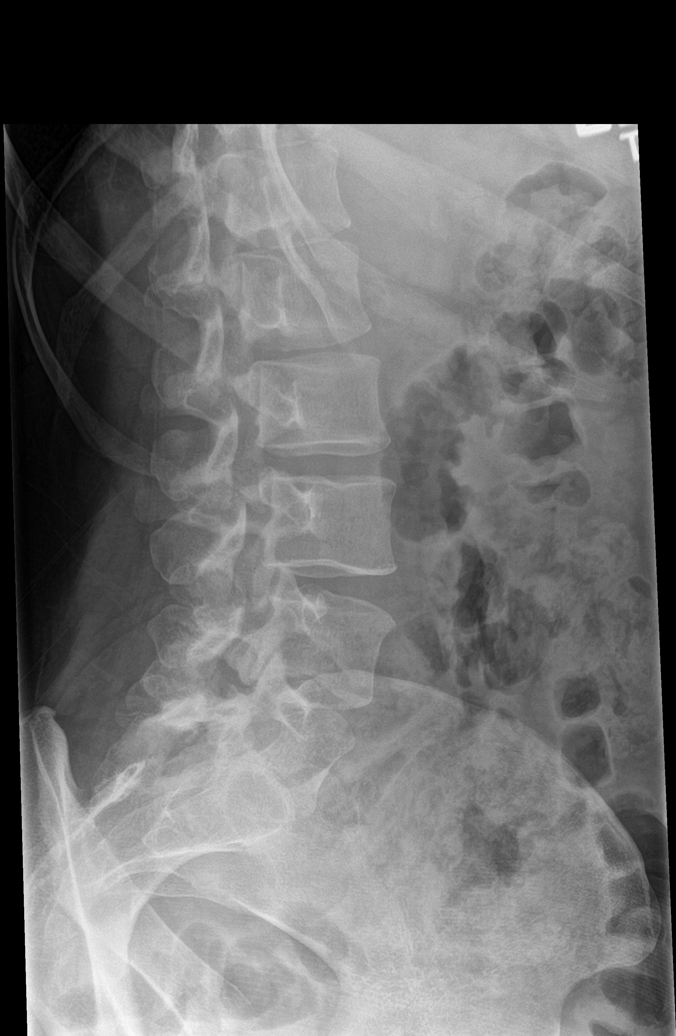

[l-spine lat]
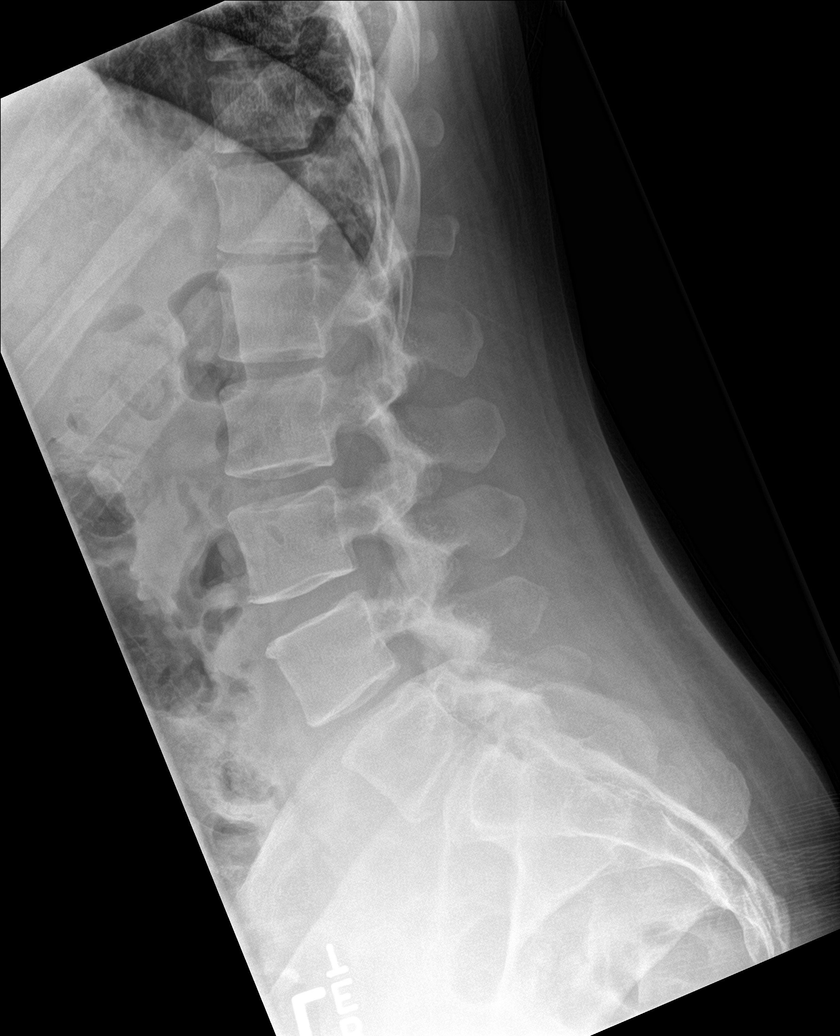

[l-spine spot]
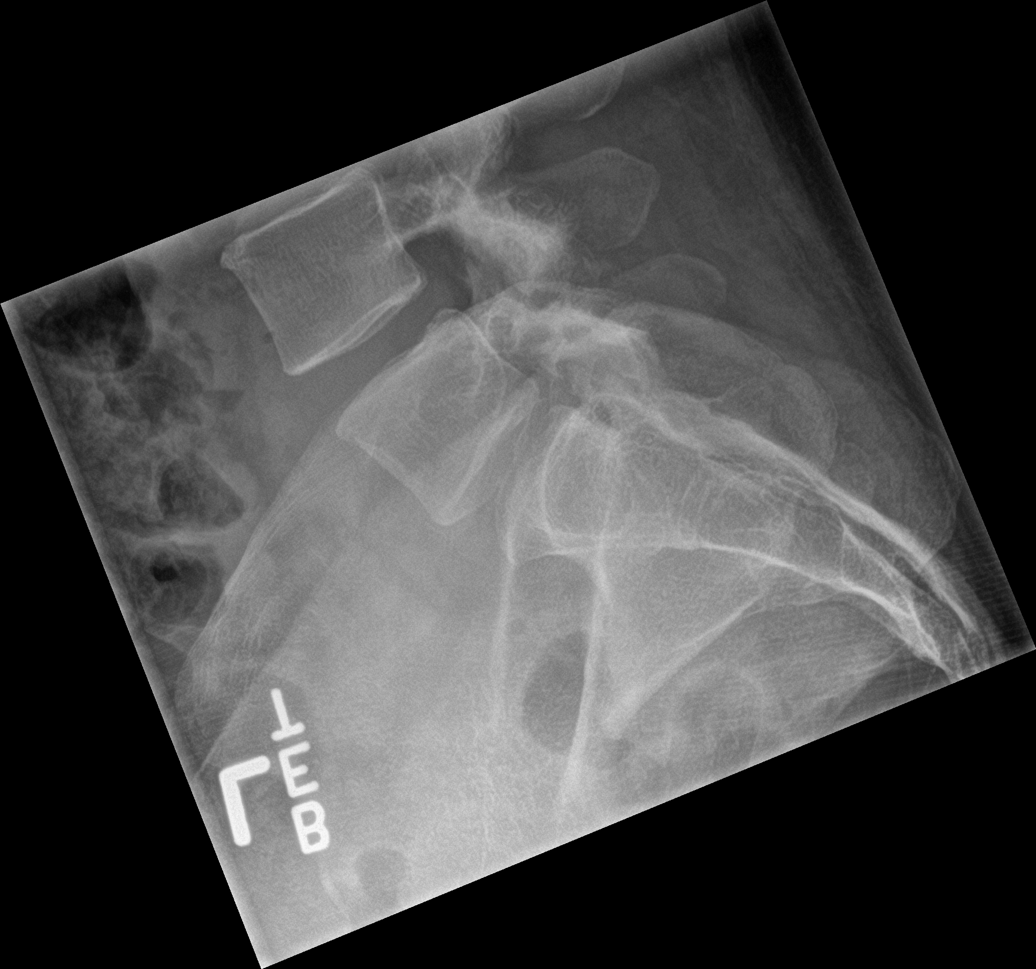

[5 of 5 positions shown; findings below may reference images not displayed]

FINDINGS: There is no evidence of lumbar spine fracture. Alignment is normal.
Intervertebral disc spaces are maintained.
IMPRESSION: Negative.

## 2016-01-27 IMAGING — DX DG HIP (WITH OR WITHOUT PELVIS) 2-3V*L*
3 series · 3 of 3 positions shown · non-contrast
Comparison: None.

CLINICAL DATA: Patient slipped and fell at work today. Pain LEFT
hip and leg.

EXAM:
DG HIP (WITH OR WITHOUT PELVIS) 2-3V LEFT

[pelvis ap]
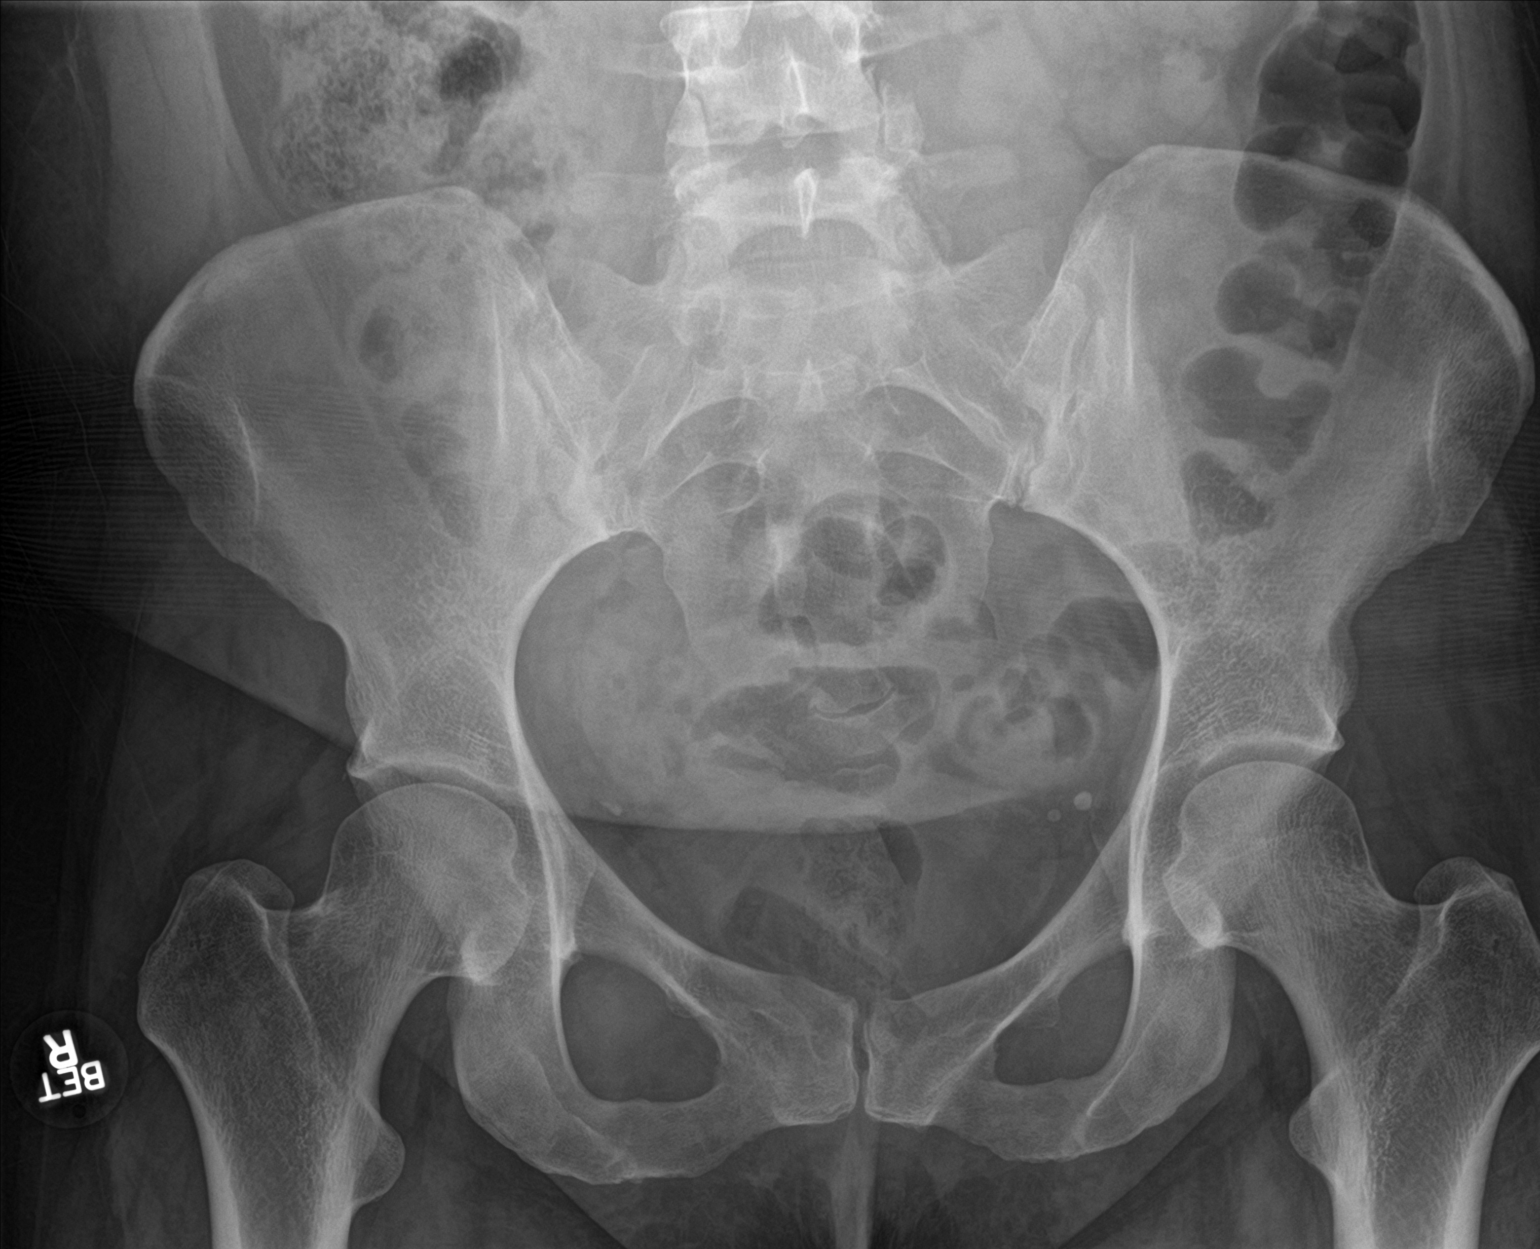

[hip ap]
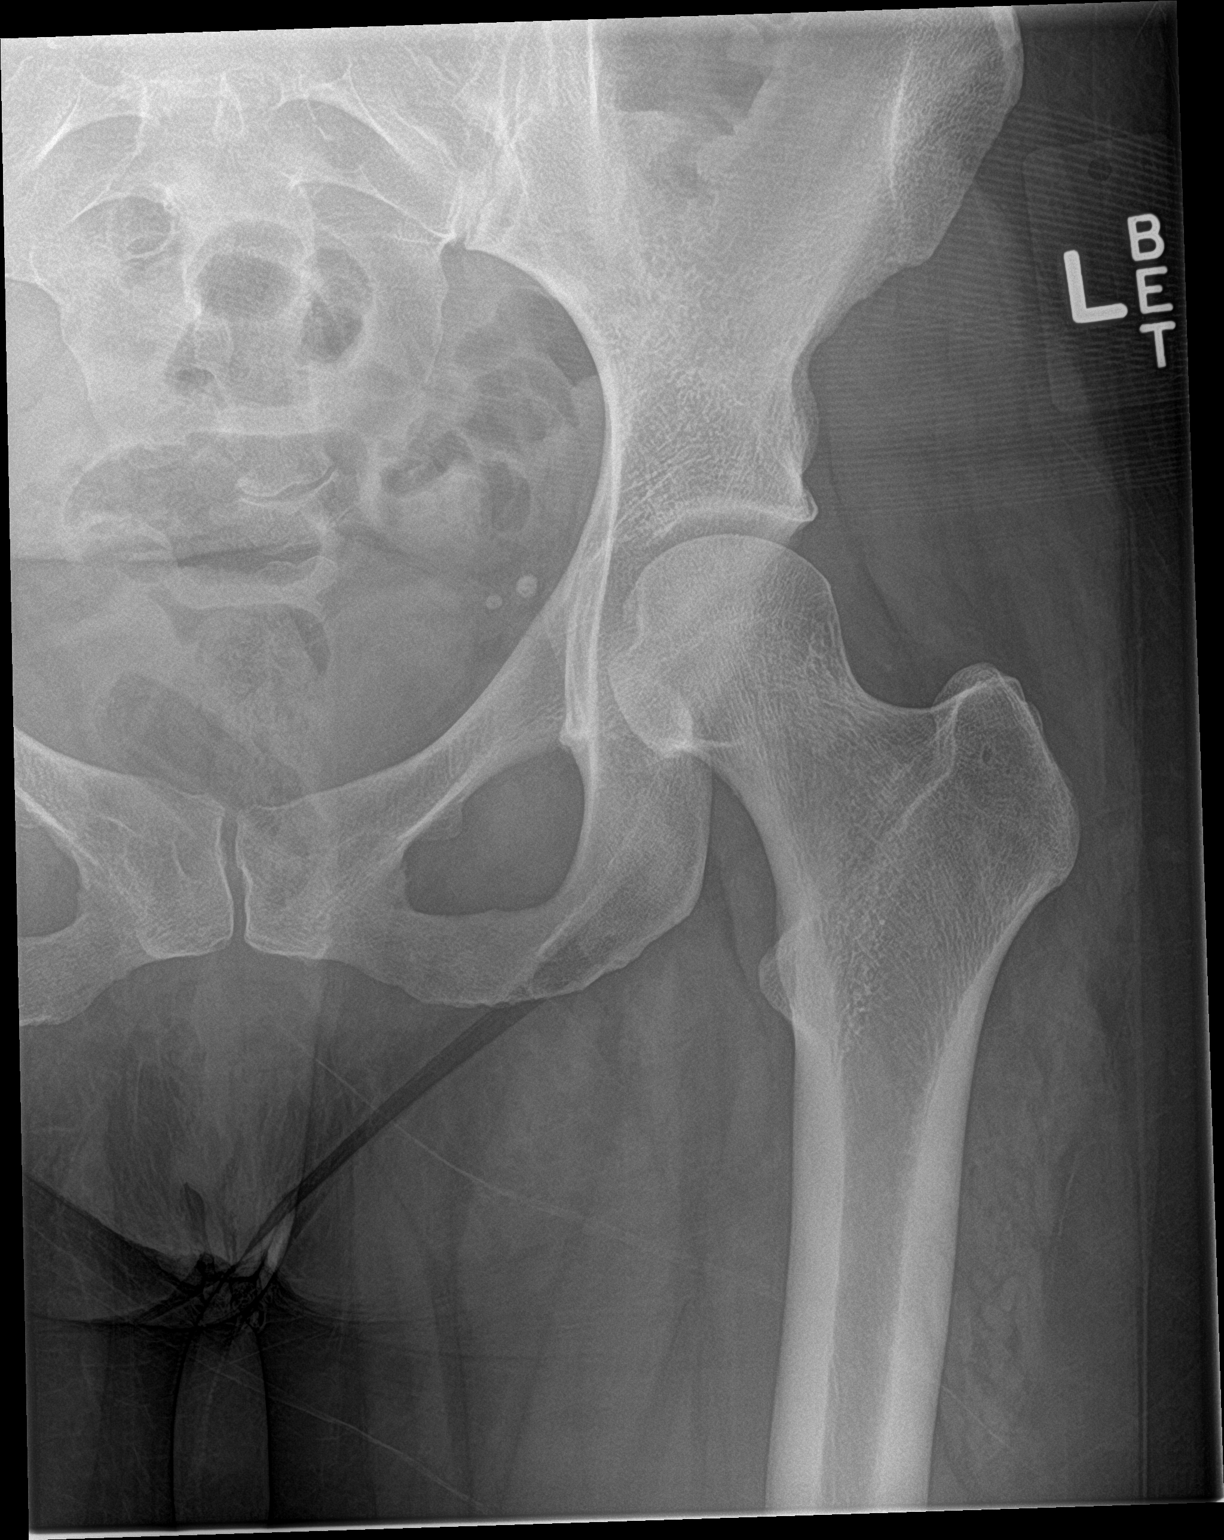

[hip lat]
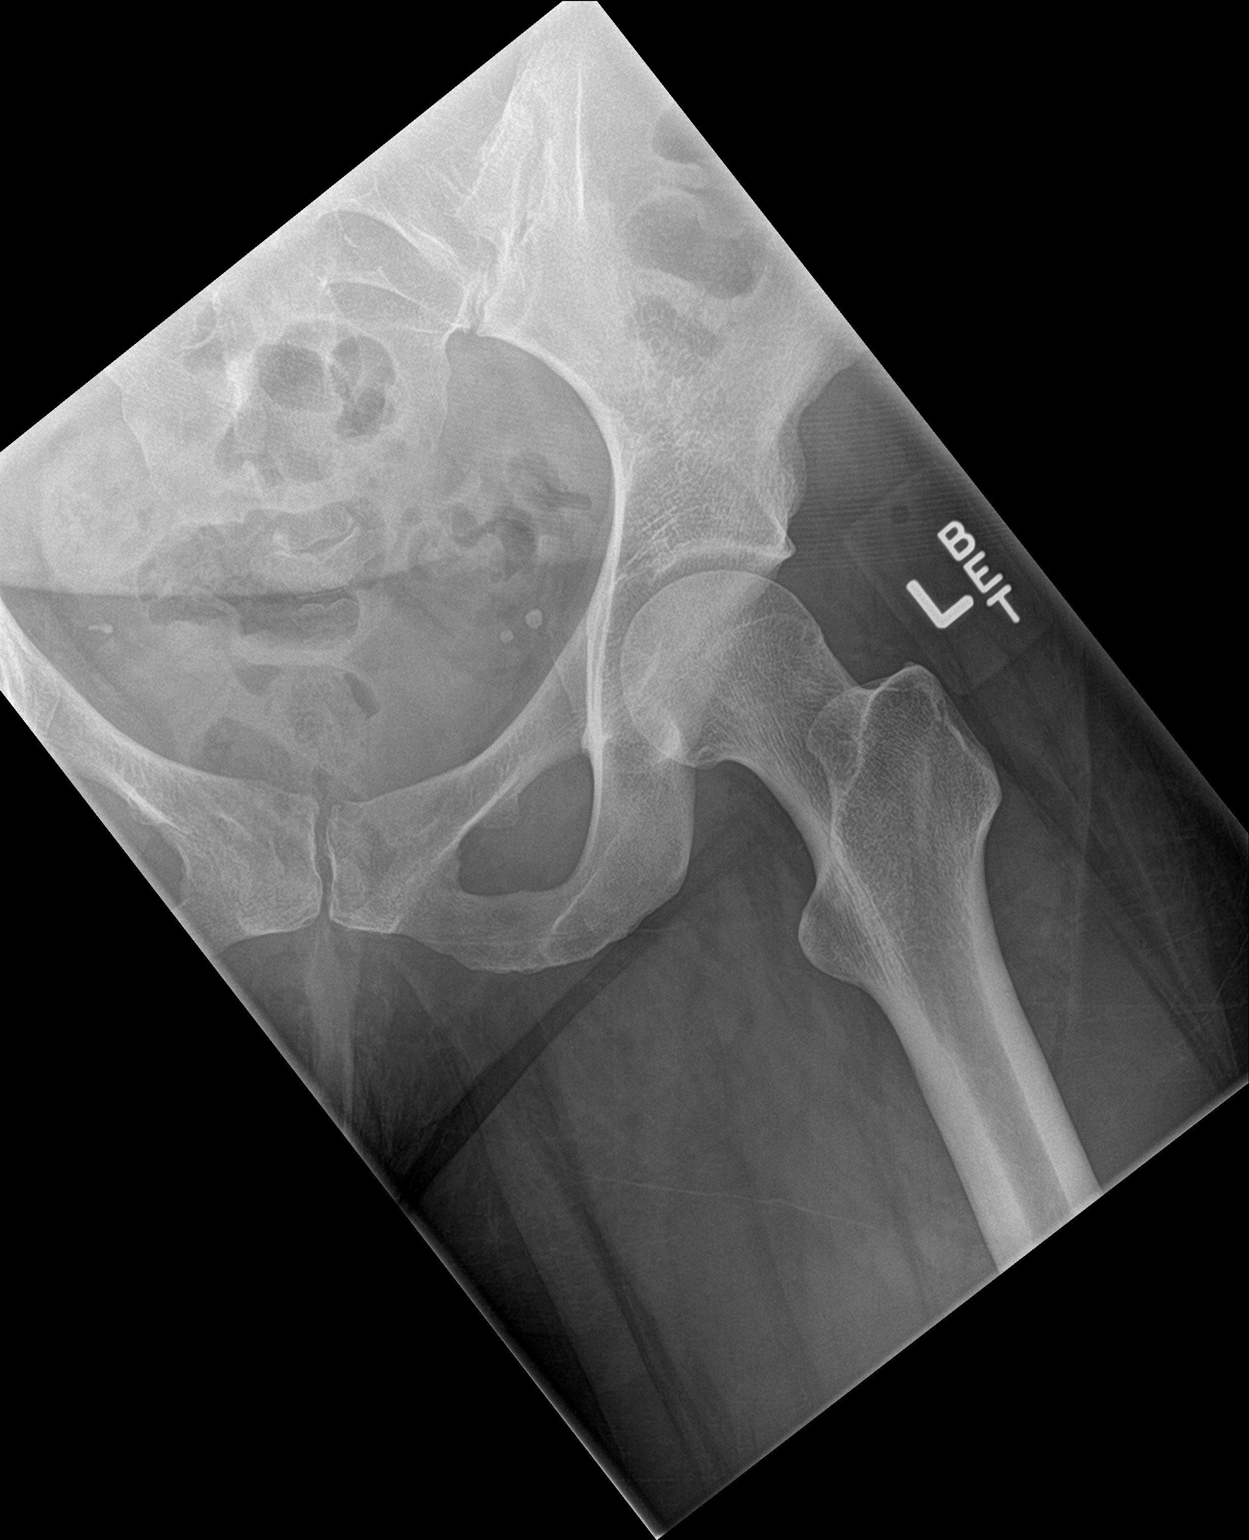

[3 of 3 positions shown; findings below may reference images not displayed]

FINDINGS: There is no evidence of hip fracture or dislocation. There is no
evidence of arthropathy or other focal bone abnormality.
IMPRESSION: Negative.

## 2016-01-27 IMAGING — DX DG KNEE COMPLETE 4+V*L*
4 series · 4 of 4 positions shown · non-contrast
Comparison: None.

CLINICAL DATA: Patient slipped and fell twice at work today. Left
knee pain.

EXAM:
LEFT KNEE - COMPLETE 4+ VIEW

[knee ap (1 of 3)]
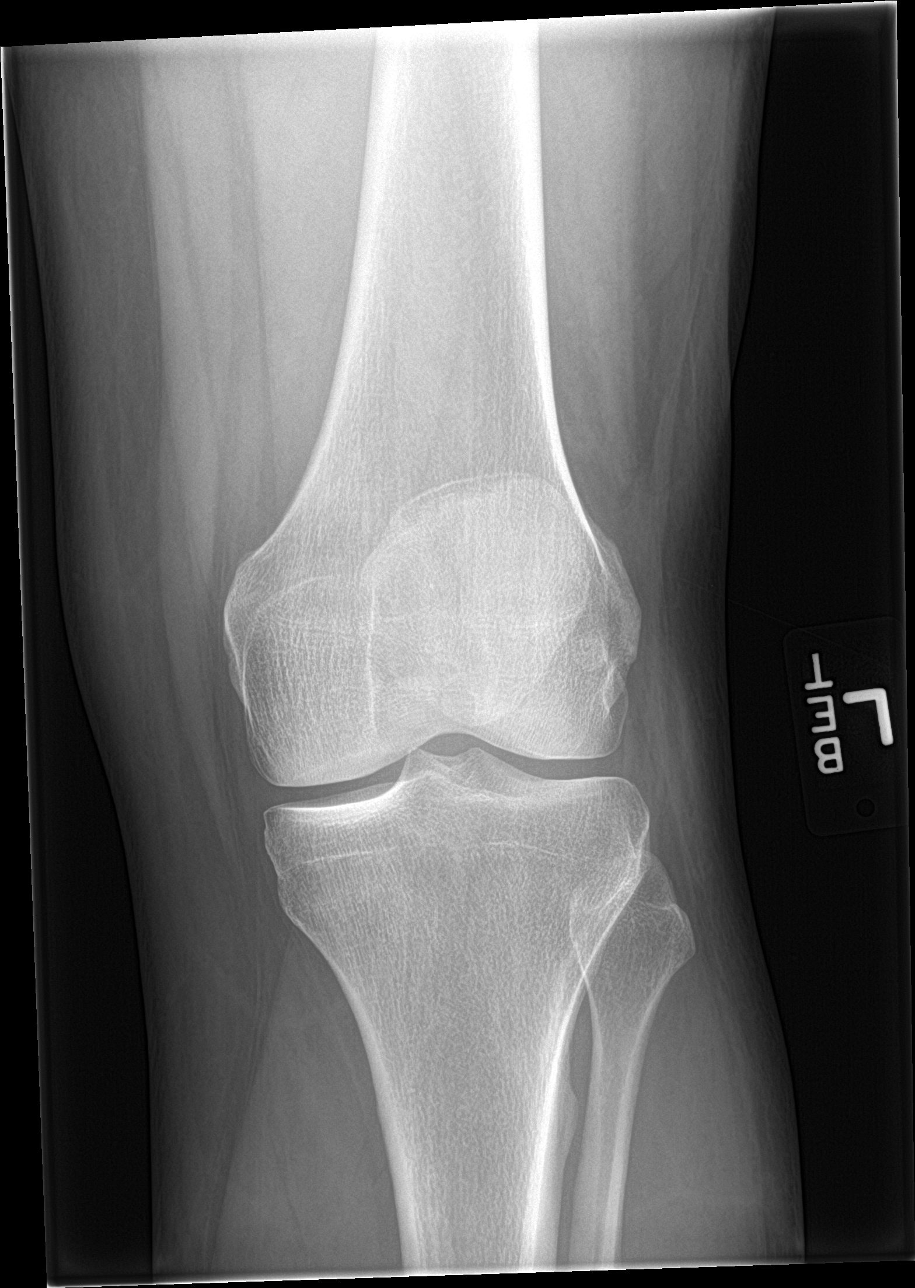

[knee ap (2 of 3)]
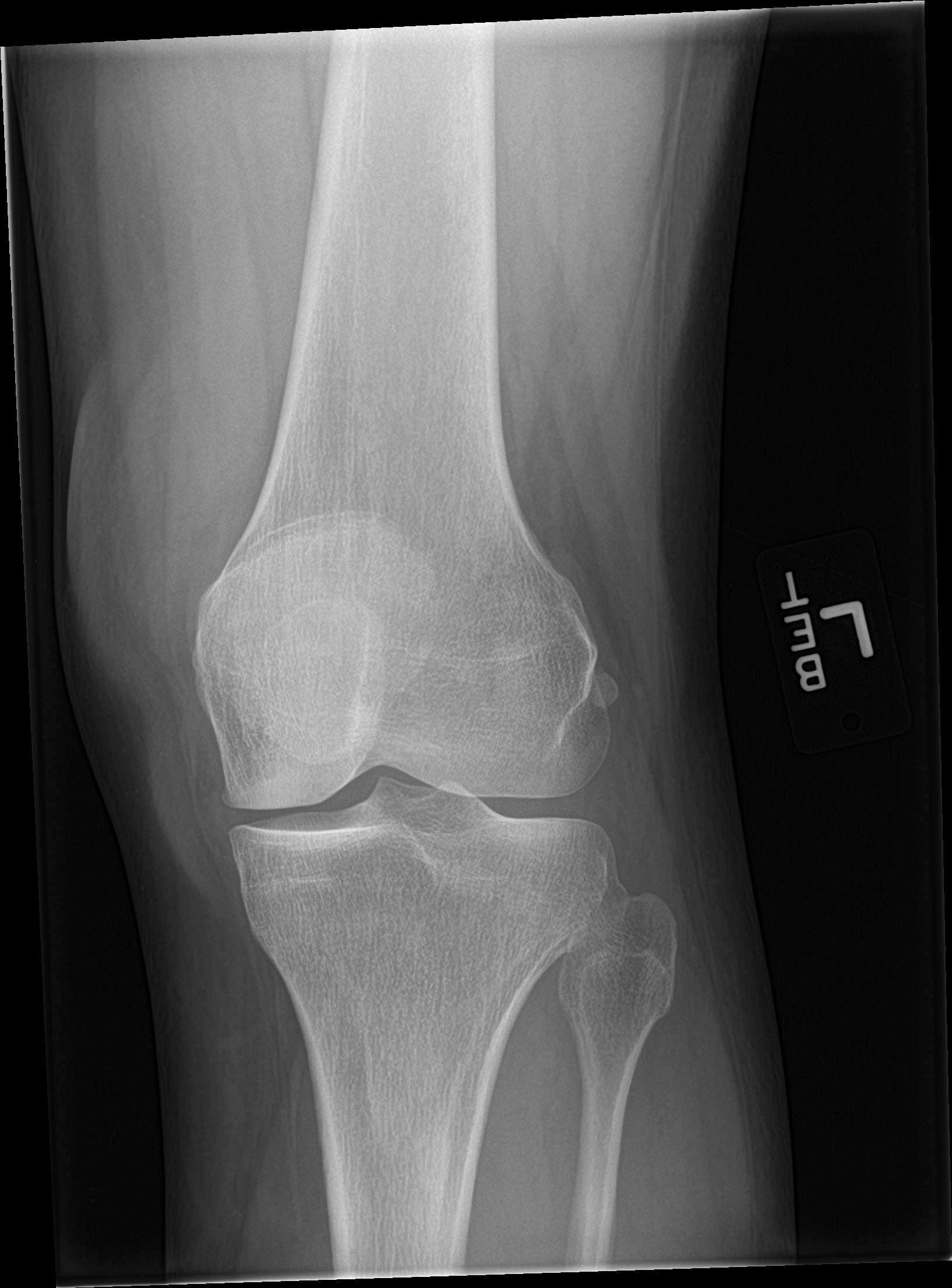

[knee ap (3 of 3)]
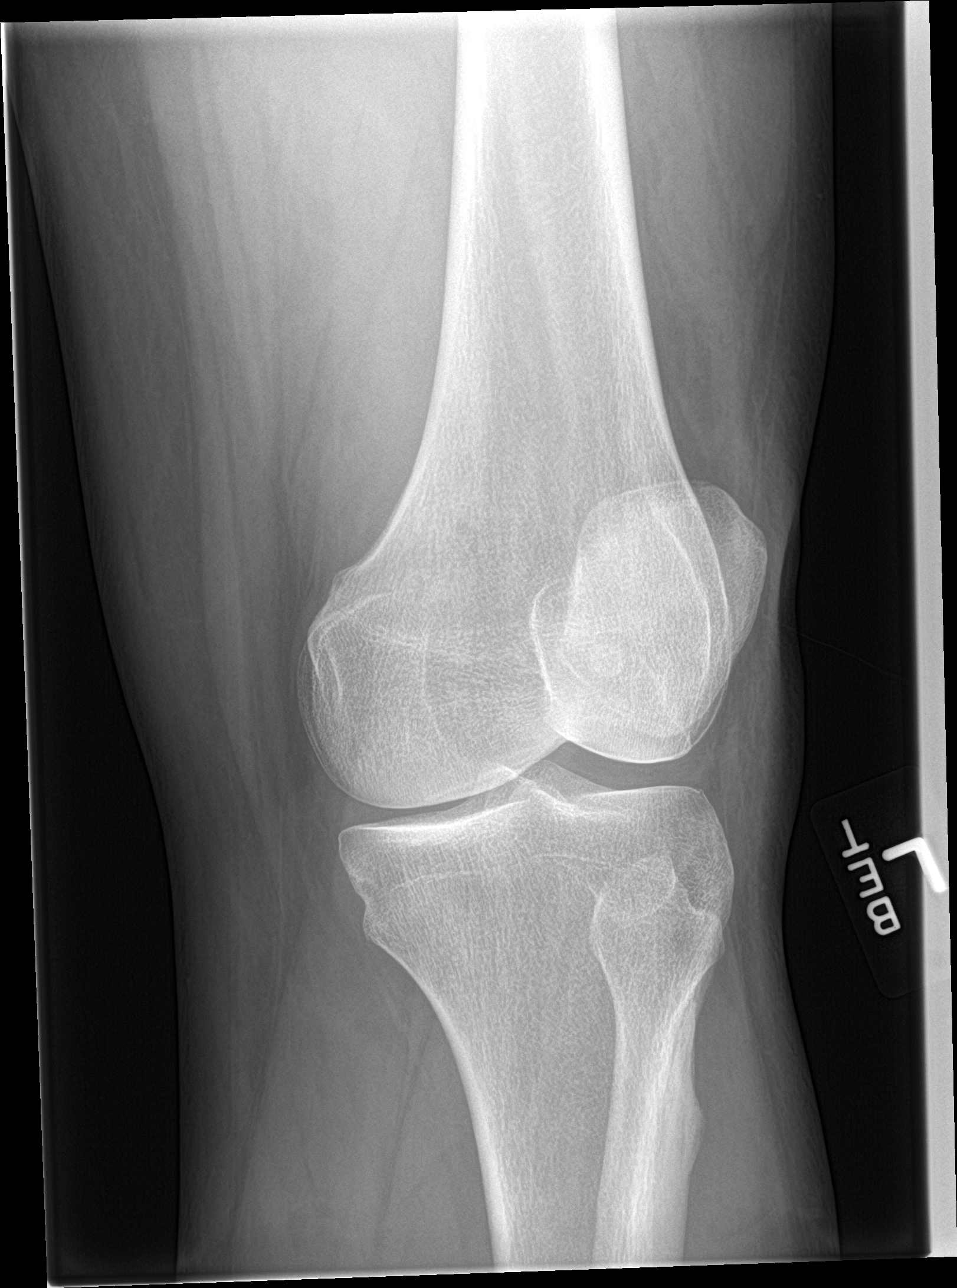

[knee lat]
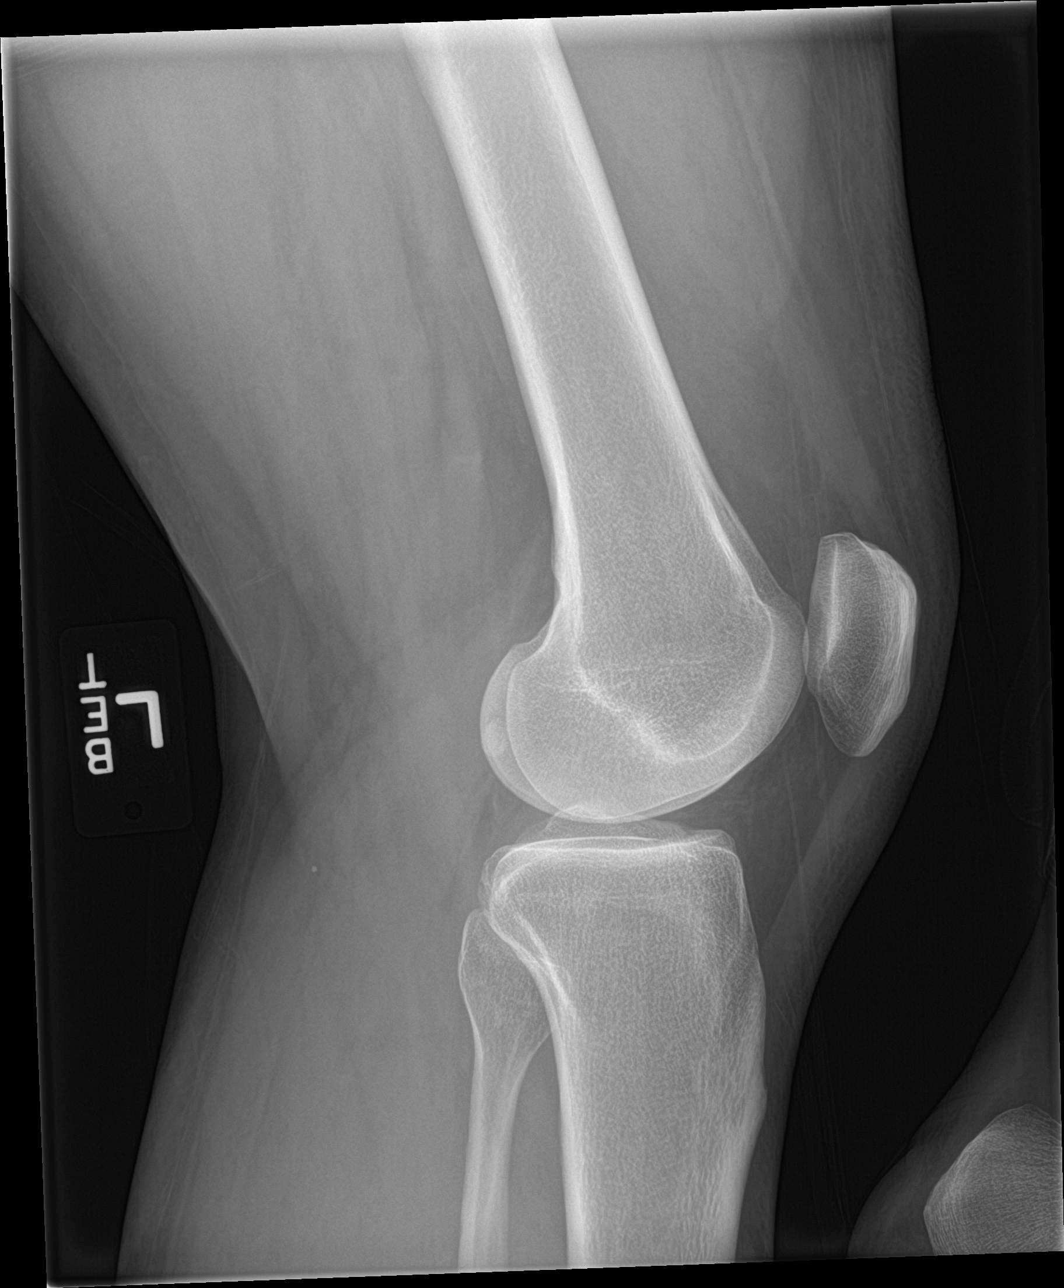

[4 of 4 positions shown; findings below may reference images not displayed]

FINDINGS: No evidence of fracture, dislocation, or joint effusion. No evidence
of arthropathy or other focal bone abnormality. Soft tissues are
unremarkable.
IMPRESSION: Negative.

## 2016-01-27 IMAGING — DX DG CERVICAL SPINE COMPLETE 4+V
7 series · 7 of 7 positions shown · non-contrast
Comparison: None.

CLINICAL DATA: Patient slipped and fell at work twice today. Left
shoulder pain radiates into the left neck.

EXAM:
CERVICAL SPINE - COMPLETE 4+ VIEW

[c-spine lat]
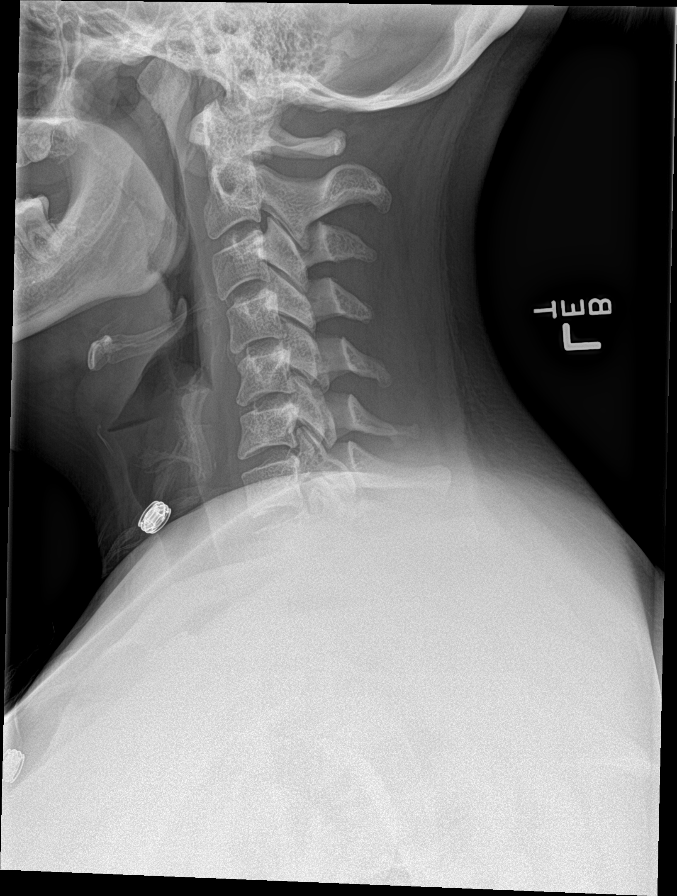

[c-spine obl (1 of 2)]
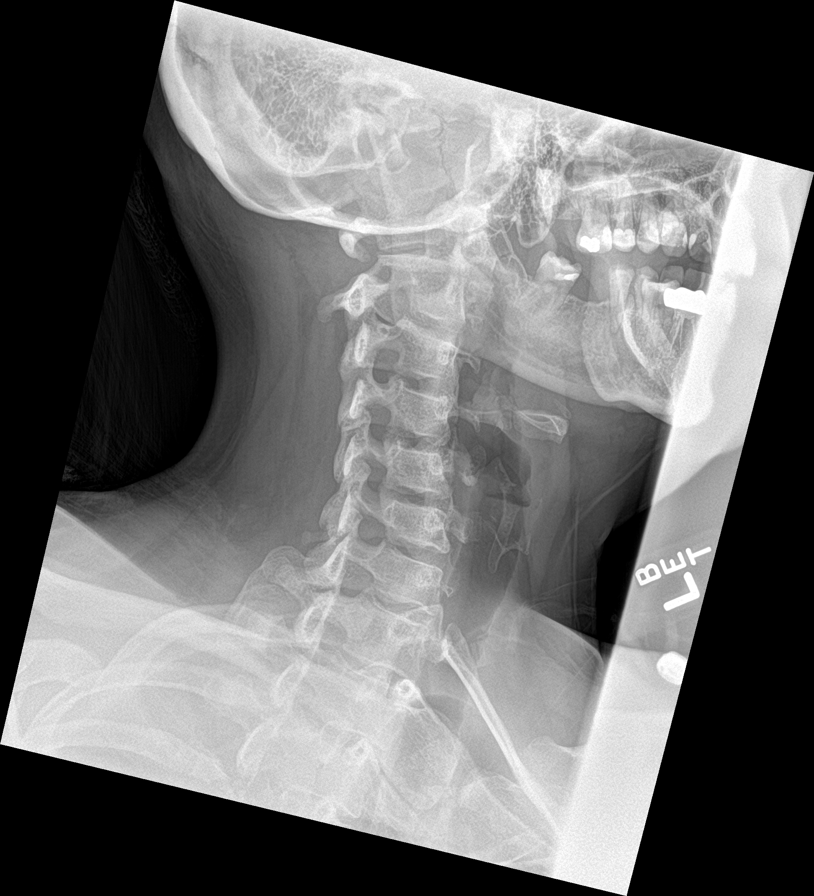

[c-spine obl (2 of 2)]
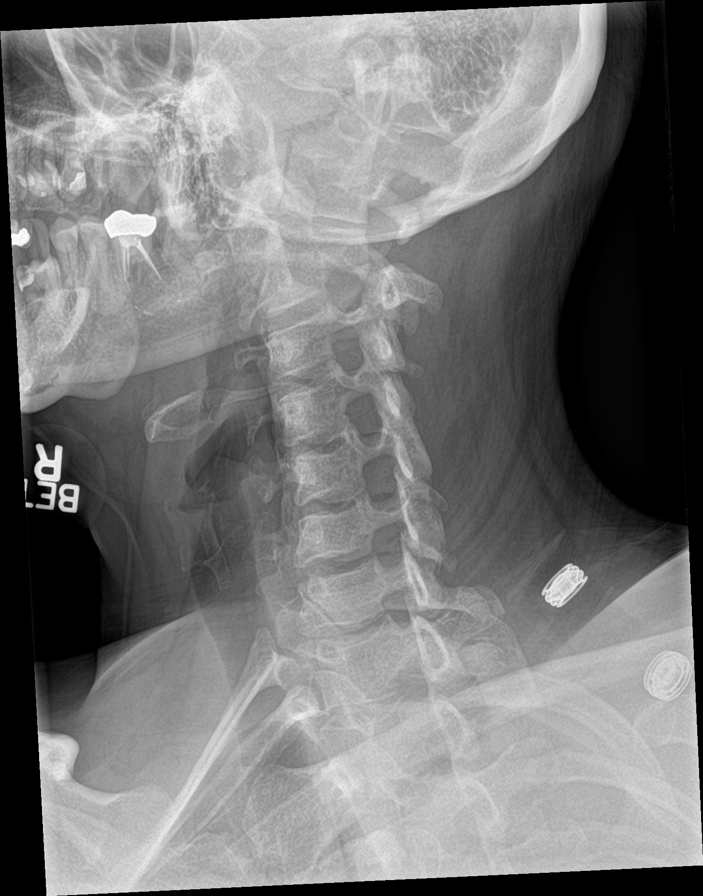

[c-spine ap]
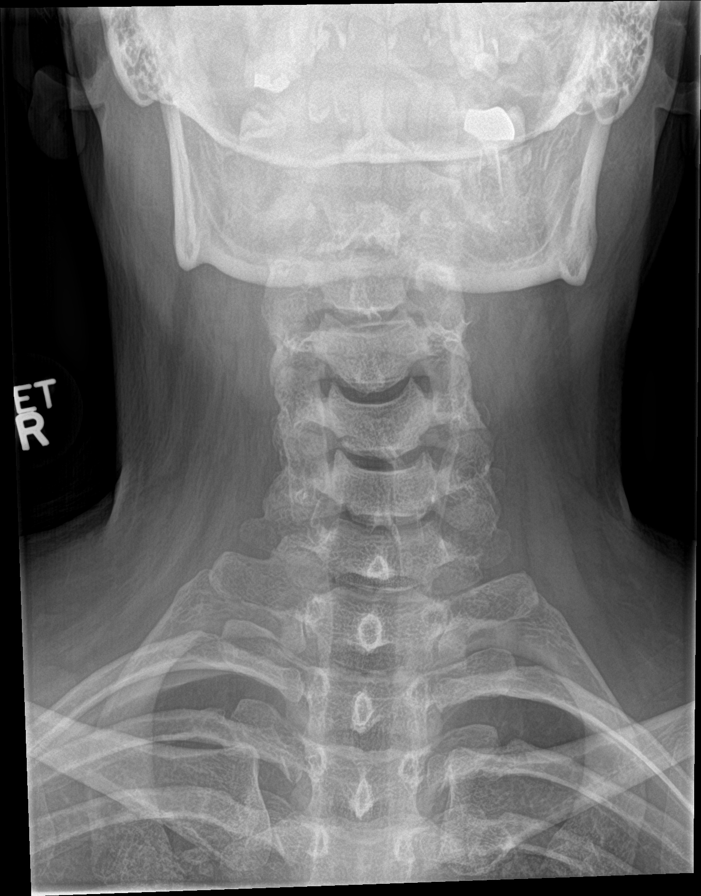

[c-spine swimmers trauma]
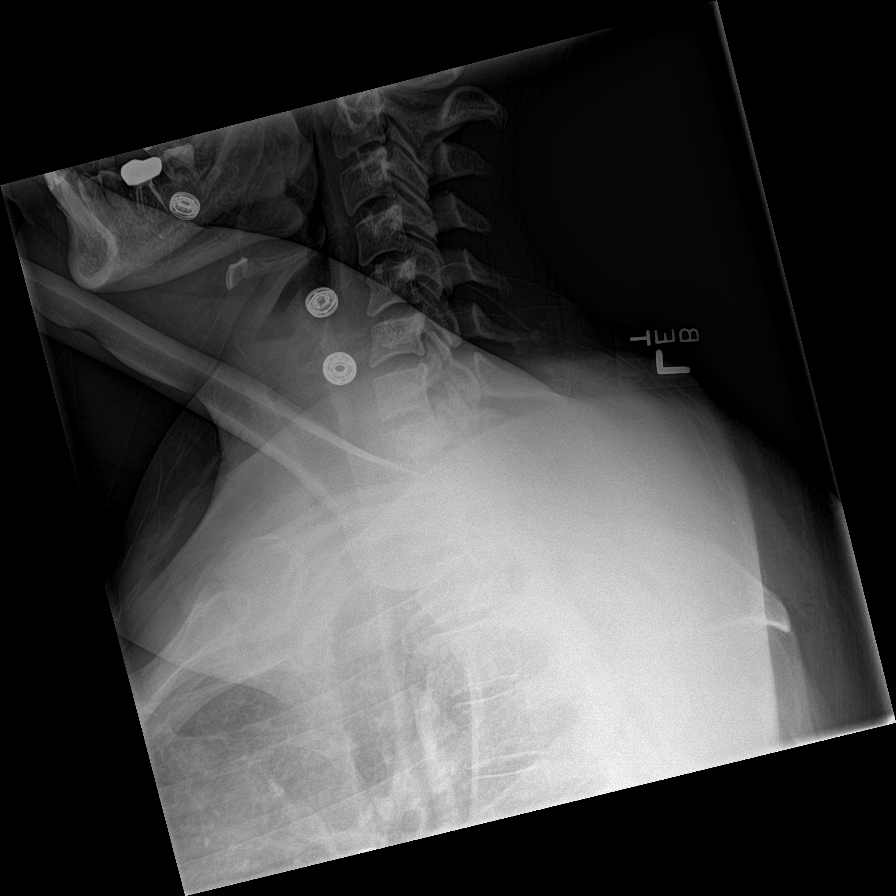

[c-spine open mouth (1 of 2)]
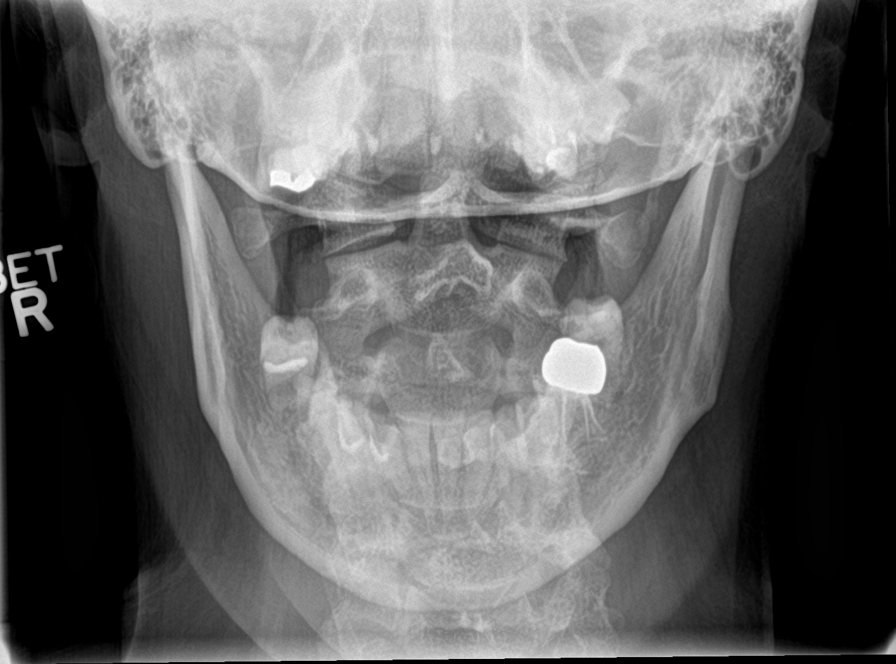

[c-spine open mouth (2 of 2)]
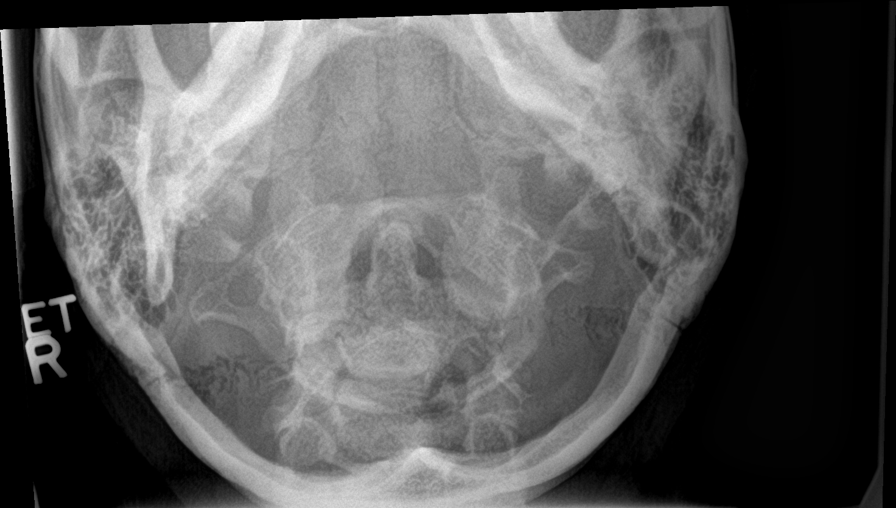

[7 of 7 positions shown; findings below may reference images not displayed]

FINDINGS: There is no evidence of cervical spine fracture or prevertebral soft
tissue swelling. Alignment is normal. No other significant bone
abnormalities are identified.
IMPRESSION: Negative cervical spine radiographs.

## 2016-01-27 MED ORDER — CYCLOBENZAPRINE HCL 5 MG PO TABS: 5.0000 mg | ORAL_TABLET | Freq: Three times a day (TID) | ORAL | 0 refills | Status: DC | PRN

## 2016-01-27 MED ORDER — OXYCODONE-ACETAMINOPHEN 5-325 MG PO TABS
1.0000 | ORAL_TABLET | Freq: Once | ORAL | Status: AC
  Administered 2016-01-27: 1 via ORAL
  Filled 2016-01-27: qty 1

## 2016-01-27 MED ORDER — OXYCODONE-ACETAMINOPHEN 5-325 MG PO TABS: 1.0000 | ORAL_TABLET | ORAL | 0 refills | Status: DC | PRN

## 2016-01-27 MED ORDER — IBUPROFEN 800 MG PO TABS
800.0000 mg | ORAL_TABLET | Freq: Once | ORAL | Status: AC
  Administered 2016-01-27: 800 mg via ORAL
  Filled 2016-01-27: qty 1

## 2016-01-27 MED ORDER — PREDNISONE 10 MG PO TABS: ORAL_TABLET | ORAL | 0 refills | Status: DC

## 2016-01-27 NOTE — Discharge Instructions (Signed)
Do not drive within 4 hours of taking oxycodone as this will make you drowsy.  Avoid lifting,  Bending,  Twisting or any other activity that worsens your pain over the next week.  Apply an  icepack  to your lower back for 10-15 minutes every 2 hours for the next 2 days, you may add a heating pad starting Sunday 20 minutes several times daily.  You should get rechecked if your symptoms are not better over the next 5 days,  Or you develop increased pain,  Weakness in your leg(s) or loss of bladder or bowel function - these are symptoms of a worse injury.

## 2016-01-27 NOTE — ED Triage Notes (Signed)
Fell at work times 2. States she is slipping on the floor.  C/o left hip pain.

## 2016-01-27 NOTE — ED Notes (Signed)
Patient given discharge instruction, verbalized understand. IV removed, band aid applied. Patient wheelchair out of the department.  

## 2016-01-29 NOTE — ED Provider Notes (Signed)
[facility]PT Provider Note   CSN: [id number] Arrival date & time: 01/27/16  1459     History   Chief Complaint Chief Complaint  Patient presents with  . Fall    HPI Amy Schroeder is a 35 y.o. female with a history of chronic neck and back pain presenting with pain in her left shoulder, knee and hip along with increase in her chronic low back and neck pain since she slipped in water at her work, landing on her left side today.  She denies hitting her head or loc.  She was ambulatory after the event but now her pain is worsening and she feels increased stiffness since the event. She has taken ibuprofen prior to arrival without relief of symptoms. There has been no weakness or numbness in the lower extremities and no urinary or bowel retention or incontinence.     The history is provided by the patient.    Past Medical History:  Diagnosis Date  . Chronic back pain   . Chronic neck pain   . Chronic pain   . Headache     There are no active problems to display for this patient.   Past Surgical History:  Procedure Laterality Date  . TUBAL LIGATION      OB History    No data available       Home Medications    Prior to Admission medications   Medication Sig Start Date End Date Taking? Authorizing Provider  ALPRAZolam Prudy Feeler) 1 MG tablet Take 1 mg by mouth 3 (three) times daily. May take as needed   Yes Historical Provider, MD  buprenorphine (SUBUTEX) 8 MG SUBL SL tablet Place 8 mg under the tongue 3 (three) times daily.   Yes Historical Provider, MD  ibuprofen (ADVIL,MOTRIN) 200 MG tablet Take 800 mg by mouth every 6 (six) hours as needed for pain.    Yes Historical Provider, MD  cyclobenzaprine (FLEXERIL) 5 MG tablet Take 1 tablet (5 mg total) by mouth 3 (three) times daily as needed for muscle spasms. 01/27/16   Burgess Amor, PA-C  oxyCODONE-acetaminophen (PERCOCET/ROXICET) 5-325 MG tablet Take 1 tablet by mouth every 4 (four) hours as needed. 01/27/16   Burgess Amor,  PA-C  predniSONE (DELTASONE) 10 MG tablet 6, 5, 4, 3, 2 then 1 tablet by mouth daily for 6 days total. 01/27/16   Burgess Amor, PA-C    Family History History reviewed. No pertinent family history.  Social History Social History  Substance Use Topics  . Smoking status: Current Every Day Smoker    Types: Cigarettes  . Smokeless tobacco: Never Used  . Alcohol use No     Allergies   Neurontin [gabapentin] and Ultram [tramadol hcl]   Review of Systems Review of Systems  Constitutional: Negative for fever.  Respiratory: Negative for shortness of breath.   Cardiovascular: Negative for chest pain and leg swelling.  Gastrointestinal: Negative for abdominal distention, abdominal pain and constipation.  Genitourinary: Negative for difficulty urinating, dysuria, flank pain, frequency and urgency.  Musculoskeletal: Positive for arthralgias, back pain and neck pain. Negative for gait problem, joint swelling and myalgias.  Skin: Negative for rash.  Neurological: Negative for weakness and numbness.     Physical Exam Updated Vital Signs BP 105/67 (BP Location: Left Arm)   Pulse 67   Temp 98.3 F (36.8 C) (Oral)   Resp 20   Ht 5\' 11"  (1.803 m)   Wt 97.5 kg   LMP 01/24/2016 Comment: Negative U-preg in ED 01/27/2016  SpO2 100%   BMI 29.99 kg/m   Physical Exam  Constitutional: She appears well-developed and well-nourished.  HENT:  Head: Normocephalic.  Eyes: Conjunctivae are normal.  Neck: Normal range of motion. Neck supple.  Cardiovascular: Normal rate and intact distal pulses.   Pedal pulses normal.  Pulmonary/Chest: Effort normal.  Abdominal: Soft. Bowel sounds are normal. She exhibits no distension and no mass.  Musculoskeletal: Normal range of motion. She exhibits no edema.       Left shoulder: She exhibits normal range of motion, no tenderness, no bony tenderness, no swelling, no deformity, no spasm, normal pulse and normal strength.       Left knee: She exhibits no  swelling, no effusion, no ecchymosis, no erythema, normal alignment, no LCL laxity, normal meniscus and no MCL laxity. Tenderness found. Lateral joint line tenderness noted.       Cervical back: She exhibits no bony tenderness, no swelling, no edema, no deformity and no spasm.       Lumbar back: She exhibits tenderness. She exhibits no swelling, no edema and no spasm.  Neurological: She is alert. She has normal strength. She displays no atrophy and no tremor. No sensory deficit. Gait normal.  Reflex Scores:      Patellar reflexes are 2+ on the right side and 2+ on the left side.      Achilles reflexes are 2+ on the right side and 2+ on the left side. No strength deficit noted in hip and knee flexor and extensor muscle groups.  Ankle flexion and extension intact.  Skin: Skin is warm and dry.  Psychiatric: She has a normal mood and affect.  Nursing note and vitals reviewed.    ED Treatments / Results  Labs (all labs ordered are listed, but only abnormal results are displayed) Labs Reviewed  POC URINE PREG, ED    EKG  EKG Interpretation None       Radiology Dg Cervical Spine Complete  Result Date: 01/27/2016 CLINICAL DATA:  Patient slipped and fell at work twice today. Left shoulder pain radiates into the left neck. EXAM: CERVICAL SPINE - COMPLETE 4+ VIEW COMPARISON:  None. FINDINGS: There is no evidence of cervical spine fracture or prevertebral soft tissue swelling. Alignment is normal. No other significant bone abnormalities are identified. IMPRESSION: Negative cervical spine radiographs. Electronically Signed   By: Kennith CenterEric  Mansell M.D.   On: 01/27/2016 17:56   Dg Lumbar Spine Complete  Result Date: 01/27/2016 CLINICAL DATA:  Patient slipped and fell at work today. Left lower back pain radiates into the left hip. EXAM: LUMBAR SPINE - COMPLETE 4+ VIEW COMPARISON:  Lumbar spine MRI 12/13/2012. FINDINGS: There is no evidence of lumbar spine fracture. Alignment is normal. Intervertebral  disc spaces are maintained. IMPRESSION: Negative. Electronically Signed   By: Kennith CenterEric  Mansell M.D.   On: 01/27/2016 17:57   Dg Shoulder Left  Result Date: 01/27/2016 CLINICAL DATA:  35 y/o F; patient fell at work twice today with complaint of the left shoulder pain. EXAM: LEFT SHOULDER - 2+ VIEW COMPARISON:  None. FINDINGS: There is no evidence of fracture or dislocation. There is no evidence of arthropathy or other focal bone abnormality. Soft tissues are unremarkable. IMPRESSION: Negative. Electronically Signed   By: Mitzi HansenLance  Furusawa-Stratton M.D.   On: 01/27/2016 17:58   Dg Knee Complete 4 Views Left  Result Date: 01/27/2016 CLINICAL DATA:  Patient slipped and fell twice at work today. Left knee pain. EXAM: LEFT KNEE - COMPLETE 4+ VIEW COMPARISON:  None.  FINDINGS: No evidence of fracture, dislocation, or joint effusion. No evidence of arthropathy or other focal bone abnormality. Soft tissues are unremarkable. IMPRESSION: Negative. Electronically Signed   By: Kennith Center M.D.   On: 01/27/2016 17:56   Dg Hip Unilat W Or Wo Pelvis 2-3 Views Left  Result Date: 01/27/2016 CLINICAL DATA:  Patient slipped and fell at work today. Pain LEFT hip and leg. EXAM: DG HIP (WITH OR WITHOUT PELVIS) 2-3V LEFT COMPARISON:  None. FINDINGS: There is no evidence of hip fracture or dislocation. There is no evidence of arthropathy or other focal bone abnormality. IMPRESSION: Negative. Electronically Signed   By: Elsie Stain M.D.   On: 01/27/2016 17:55    Procedures Procedures (including critical care time)  Medications Ordered in ED Medications  oxyCODONE-acetaminophen (PERCOCET/ROXICET) 5-325 MG per tablet 1 tablet (1 tablet Oral Given 01/27/16 1659)  ibuprofen (ADVIL,MOTRIN) tablet 800 mg (800 mg Oral Given 01/27/16 1659)     Initial Impression / Assessment and Plan / ED Course  I have reviewed the triage vital signs and the nursing notes.  Pertinent labs & imaging results that were available during my care  of the patient were reviewed by me and considered in my medical decision making (see chart for details).  Clinical Course    Images reviewed and negative for acute bony injury or dislocation.  She was given oxycodone and ibuprofen here with mild improvement in sx.  Review of chart indicates pt on buprenorphine for chronic pain.  Cancelled oxycodone script. Pt prescribed prednisone taper, flexeril, advised ice tx for 2 days, adding heat on day 3.  Prn f/u with pcp if not improving over the next 10 days.  Final Clinical Impressions(s) / ED Diagnoses   Final diagnoses:  Fall, initial encounter  Cervical strain, acute, initial encounter  Lumbar radiculopathy, acute  Contusion of left hip, initial encounter    New Prescriptions Discharge Medication List as of 01/27/2016  6:18 PM    START taking these medications   Details  cyclobenzaprine (FLEXERIL) 5 MG tablet Take 1 tablet (5 mg total) by mouth 3 (three) times daily as needed for muscle spasms., Starting Thu 01/27/2016, Print      predniSONE (DELTASONE) 10 MG tablet 6, 5, 4, 3, 2 then 1 tablet by mouth daily for 6 days total., Print         Burgess Amor, PA-C 01/29/16 1334    Maia Plan, MD 01/30/16 1807

## 2016-03-24 ENCOUNTER — Ambulatory Visit (INDEPENDENT_AMBULATORY_CARE_PROVIDER_SITE_OTHER): Payer: Medicaid Other | Admitting: Sports Medicine

## 2016-03-24 ENCOUNTER — Ambulatory Visit (INDEPENDENT_AMBULATORY_CARE_PROVIDER_SITE_OTHER): Payer: Medicaid Other

## 2016-03-24 ENCOUNTER — Encounter (INDEPENDENT_AMBULATORY_CARE_PROVIDER_SITE_OTHER): Payer: Self-pay | Admitting: Sports Medicine

## 2016-03-24 VITALS — BP 108/81 | HR 79 | Ht 71.0 in | Wt 215.0 lb

## 2016-03-24 DIAGNOSIS — G8929 Other chronic pain: Secondary | ICD-10-CM | POA: Diagnosis not present

## 2016-03-24 DIAGNOSIS — Z720 Tobacco use: Secondary | ICD-10-CM

## 2016-03-24 DIAGNOSIS — M545 Low back pain: Secondary | ICD-10-CM

## 2016-03-24 MED ORDER — CALCITONIN (SALMON) 200 UNIT/ACT NA SOLN: 1.0000 | Freq: Every day | NASAL | 1 refills | Status: DC

## 2016-03-24 NOTE — Progress Notes (Signed)
Amy Schroeder - 35 y.o. female MRN [id number]  Date of birth: June 16, 1980  Office Visit Note: Visit Date: 03/24/2016 PCP: Jearld Leschwight M Williams, MD Referred by: No ref. provider found  Subjective: Chief Complaint  Patient presents with  . Lower Back - Follow-up  . Follow-up    Patient states she slipped and fell at work in April.  Slipped and fell again in September at her job.  Had MRI done in Oct. 2017.  States still having back pain that is radiating into both of legs. Having numbness and tingling.  Can't lay on right side.   HUurts to walk, sit and stand.   HPI: Patient reports that she has had 2 falls over the past 6 months dating back initially to April of this past year she is unaware of the exact date. 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 She has had severe pain since her most recent fall in September & has previously been evaluated with x-rays, MRIs & is currently out of work with limited lifting restrictions. She is here today on her own requesting a second opinion. She is not bringing any significant documentation with her but does bring the MRI images only from an MRI of her lumbar spine. She lives in [place]. She reports the pain is not helped with anti-inflammatories & muscle relaxers prednisone dose pack & the Subutex & Xanax that she is on for her anxiety & prior substance abuse are no longer controlling her symptoms. Pain is described as a deep aching pain that is worse with any type of motion. Localized to the lower lumbar & upper sacral regions that radiates into the upper back. She has no significant pain that radiates into her legs. She denies any changes in bowel or bladder. She has severe pain but does generally sleep without significant nighttime awakenings due to this issue. She denies any fevers, chills or night sweats or associated weight loss. ROS: Otherwise per HPI.    Clinical History: No specialty comments available.  She reports that she has been smoking Cigarettes.  She has never used smokeless tobacco.  No results for input(s): HGBA1C, LABURIC in the last 8760 hours.  Assessment & Plan: Visit Diagnoses:  1. Chronic bilateral low back pain, with sciatica presence unspecified   2. Tobacco use    Plan: Greater than 50% of this 30 minute visit was spent in direct patient counseling & coordination of care. I reviewed the MRI that she had with her today & without the radiology report I do not see any acute abnormality that would explain the severe worsening of her back pain. She does mention that she has possibly been told she has a stress fracture although I could not appreciate this on my own reading today nor do I see anything suggestive of a healed stress fracture on x-rays obtained today. Nevertheless we discussed non-opioid & non-benzodiazepine related treatment options as she has tried multiple pharmacologic interventions as outlined above. Nasal calcitonin rx sent in today to see if this can help modulate some of the discomfort that potentially could be coming from a compression fracture. I also like for her to begin working on gentle range of motion & provided her a handout with [facility] spine conditioning program. Ultimately I would like to see her back as soon as I'm able to obtain & review the records of what is Artie been completed & release  of information forms were completed today by the patient prior to leaving. We did discuss that chronic opioid therapy is likely not appropriate nor is titration of her benzodiazepines except by her current prescriber. Until further information is obtained I am not able to make any new recommendations regarding her work status. We did discuss the importance of tobacco abuse & tobacco cessation & currently she is pre-contemplative. Follow-up: Return in about 2 weeks (around 04/07/2016).  Meds:  Meds ordered this  encounter  Medications  . calcitonin, salmon, (MIACALCIN) 200 UNIT/ACT nasal spray    Sig: Place 1 spray into alternate nostrils daily.    Dispense:  3.7 mL    Refill:  1   Procedures: No notes on file   Objective:  VS:  HT:5\' 11"  (180.3 cm)   WT:215 lb (97.5 kg)  BMI:30    BP:108/81  HR:79bpm  TEMP: ( )  RESP:  Physical Exam:  adult female. In no acute respiratory distress. Alert and interactive.  Tangential and stream of thought, thought process.  Back: Patient sitting on mainly on her left side & quite uncomfortable but shifting from side to side. She has pain that localizes to the back with straight leg raise with no radicular component. She has pain with palpation diffusely along the low back most focally midline over the sacrum & lower lumbar regions. Lower extremity sensation is intact to light touch. Lower extremity strength is intact to manual muscle testing at the her cells pedis posterior tibialis & knee extensors. Lower extremity reflexes are diminished diffusely at trace only & these are symmetric.  Imaging: No results found.   Past Medical/Family/Surgical/Social History: Medications & Allergies reviewed per EMR There are no active problems to display for this patient.  Past Medical History:  Diagnosis Date  . Chronic back pain   . Chronic neck pain   . Chronic pain   . Headache    No family history on file. Past Surgical History:  Procedure Laterality Date  . TUBAL LIGATION     Social History   Occupational History  . Not on file.   Social History Main Topics  . Smoking status: Current Every Day Smoker    Types: Cigarettes  . Smokeless tobacco: Never Used  . Alcohol use No  . Drug use: No  . Sexual activity: Yes    Birth control/ protection: Surgical

## 2016-03-27 ENCOUNTER — Telehealth (INDEPENDENT_AMBULATORY_CARE_PROVIDER_SITE_OTHER): Payer: Self-pay | Admitting: Sports Medicine

## 2016-03-27 NOTE — Telephone Encounter (Signed)
Rx request 

## 2016-03-27 NOTE — Telephone Encounter (Signed)
This was sent in to her pharmacy on Friday . . . It was was nasal calcitonin spray.  We can resend if need be but it should be there

## 2016-03-28 NOTE — Telephone Encounter (Signed)
See note

## 2016-03-28 NOTE — Telephone Encounter (Signed)
Talked with patient and advised her that Rx was sent to her pharmacy on Friday.

## 2016-03-29 ENCOUNTER — Telehealth (INDEPENDENT_AMBULATORY_CARE_PROVIDER_SITE_OTHER): Payer: Self-pay

## 2016-03-29 NOTE — Telephone Encounter (Signed)
Talked with patient on 03/28/16 and patient stated that Office notes needed to be faxed to her lawyer, Amedeo GoryGiovanna.  Faxed office note to [phone] attn: Giovanna.

## 2016-04-07 ENCOUNTER — Encounter (INDEPENDENT_AMBULATORY_CARE_PROVIDER_SITE_OTHER): Payer: Self-pay | Admitting: Sports Medicine

## 2016-04-07 ENCOUNTER — Ambulatory Visit (INDEPENDENT_AMBULATORY_CARE_PROVIDER_SITE_OTHER): Payer: Medicaid Other | Admitting: Sports Medicine

## 2016-04-07 VITALS — BP 114/73 | HR 78 | Ht 71.0 in | Wt 215.0 lb

## 2016-04-07 DIAGNOSIS — M545 Low back pain: Secondary | ICD-10-CM | POA: Diagnosis not present

## 2016-04-07 DIAGNOSIS — Z720 Tobacco use: Secondary | ICD-10-CM | POA: Diagnosis not present

## 2016-04-07 DIAGNOSIS — G8929 Other chronic pain: Secondary | ICD-10-CM

## 2016-04-10 NOTE — Progress Notes (Signed)
Amy Schroeder - 35 y.o. female MRN [id number]  Date of birth: 1980/11/24  Office Visit Note: Visit Date: 04/07/2016 PCP: Jearld Leschwight M Williams, MD Referred by: Jearld LeschWilliams, Dwight M, MD  Subjective: Chief Complaint  Patient presents with  . Lower Back - Follow-up  . Follow-up    Patient states back is feeling worse.  States she has been doing exercises.  Feels like it's not getting any better.    HPI: Patient presents for reevaluation of low back pain. Please see my prior note for outline of her history of present illness. She reports no changes at this time except for the worsening of the pain with activity & exercises. Prior MRI images were reviewed & results were obtained today. These did show that there is a small hyperintensity within the pedicles on the sternum & T2-weighted images that may indicate a stress reaction otherwise normal MRI. There is no spinal stenosis or disc herniation or evidence of nerve root regimen. She does have a small amount of facet arthritis at L4-5.  ROS: Denies fevers, chills, recent weight gain or weight loss.  No night sweats. No significant nighttime awakenings due to this issue.  Pt denies any interval change in bowel or bladder habits, muscle weakness, numbness or falls associated with this pain.. Otherwise per HPI.   Clinical History: No specialty comments available.  She reports that she has been smoking Cigarettes.  She has never used smokeless tobacco.  No results for input(s): HGBA1C, LABURIC in the last 8760 hours.  Assessment & Plan: Visit Diagnoses:    ICD-9-CM ICD-10-CM   1. Chronic bilateral low back pain, with sciatica presence unspecified 724.2 M54.5    338.29 G89.29   2. Tobacco use 305.1 Z72.0     Plan: The patient discloses during the visit that she does [facility] through her workers Location managercompensation provider. Ultimately given this is a work-related injury I will defer to their expertise since  this is not a Worker's Compensation visit.  I will defer any further evaluation & management to them at this time & discuss the importance of following the instructions of her workers Therapist, sportscompensation adjuster. Did emphasize importance of smoking cessation & greater than 5 minutes was spent on this today. Ultimately also provided counseling that low back injury such as hers are best treated with conservative measures & early return to work & return to function. I am reassured that she has no significant structural abnormalities on MRI & encouraged her to be as active as she can tolerate. Follow-up: Return if symptoms worsen or fail to improve.  Meds: No orders of the defined types were placed in this encounter.  Procedures: No notes on file   Objective:  VS:  HT:5\' 11"  (180.3 cm)   WT:215 lb (97.5 kg)  BMI:30    BP:114/73  HR:78bpm  TEMP: ( )  RESP:  Physical Exam: Adult female. Alert and appropriate.  In no acute distress.  Lower extremities are overall well aligned with no significant deformity. No significant swelling. DP & PT pulses 2+/4. No significant bruising/ecchymosis or erythema the skin Back: Markedly tight bilateral straight leg raises without radicular component or pain radiating to the actual back. She is hyperesthetic throughout her lower extremities in a nondermatomal distribution. Lower extremity reflexes are 2+/4. She is able to heel & toe walk. She walks in a crouched position.  Imaging: Xr Lumbar Spine Complete  Result Date: 03/27/2016 Findings: 4V x-ray lumbar spine: Slight levoscoliosis with moderate  with moderate degenerative change of the lower lumbar segments & significant facet hypertrophy. She has degenerative spurring between L3-L4 as well as a anterior listhesis of L4 on L5. No appreciable pars defect. Visualized hip joints are normal appearing. Visualized sacrum & coccyx is within normal limits with incomplete fusion of the coccygeal vertebrae. There is no appreciable  fracture noted. No significant soft tissue findings.   Impression: Multilevel lower degenerative spine changes at L3-4, L4-5 & L5-S1.  Xr Sacrum/coccyx  Result Date: 03/27/2016 Findings: 3V coccyx: Overall well aligned.  No significant deformity.  Unfused coccygeal vertebrae are present.  No acute fracture/dislocation.  Lower degenerative changes of the lumbar spine is noted on other films. Visualized femoroacetabular joints are normal. Impression: Normal coccygeal x-ray with underlying lower lumbar degenerative change.   Past Medical/Family/Surgical/Social History: Medications & Allergies reviewed per EMR There are no active problems to display for this patient.  Past Medical History:  Diagnosis Date  . Chronic back pain   . Chronic neck pain   . Chronic pain   . Headache    No family history on file. Past Surgical History:  Procedure Laterality Date  . TUBAL LIGATION     Social History   Occupational History  . Not on file.   Social History Main Topics  . Smoking status: Current Every Day Smoker    Types: Cigarettes  . Smokeless tobacco: Never Used  . Alcohol use No  . Drug use: No  . Sexual activity: Yes    Birth control/ protection: Surgical

## 2016-04-19 ENCOUNTER — Telehealth (INDEPENDENT_AMBULATORY_CARE_PROVIDER_SITE_OTHER): Payer: Self-pay | Admitting: *Deleted

## 2016-04-19 NOTE — Telephone Encounter (Signed)
Pt called with questions about physical therapy and where she needs to go for this

## 2016-04-19 NOTE — Telephone Encounter (Signed)
Talked with patient and advised her that medicaid will not pay for physical therapy.  Advised that she would have to wait until she goes to [facility] per her worker's comp. Provider to see if she would be referred to physical therapy.  Faxed last office  note to Giovanna at [phone] per patient request.

## 2016-09-01 ENCOUNTER — Telehealth: Payer: Self-pay

## 2016-09-01 NOTE — Telephone Encounter (Signed)
Has new patient appt May 17. Requesting sooner appt because she received 90 xanax on 4/4 and if she runs out she is scared of having seizures. Please call her back

## 2016-09-01 NOTE — Telephone Encounter (Signed)
-----   Message from Eustace Moore, MD sent at 09/01/2016 11:54 AM EDT ----- Please call patient.  Her request for sooner appointment was forwarded to me with her medicine question. Please let her know that I will not prescribe Xanax for her.  If this is the reason for visit, she needs to find another provider closer to her home.  [facility]

## 2016-09-01 NOTE — Telephone Encounter (Signed)
Called and spoke to pt, aware dr Delton See will not write xanax for her. Does NOT wish to keep appt. Here.

## 2016-09-21 ENCOUNTER — Ambulatory Visit: Payer: Self-pay | Admitting: Family Medicine

## 2016-09-26 ENCOUNTER — Ambulatory Visit: Payer: Self-pay | Admitting: Family Medicine

## 2018-02-13 DIAGNOSIS — Z23 Encounter for immunization: Secondary | ICD-10-CM | POA: Diagnosis not present

## 2018-05-21 DIAGNOSIS — Z139 Encounter for screening, unspecified: Secondary | ICD-10-CM | POA: Diagnosis not present

## 2018-05-21 DIAGNOSIS — F4310 Post-traumatic stress disorder, unspecified: Secondary | ICD-10-CM | POA: Diagnosis not present

## 2018-05-21 DIAGNOSIS — F411 Generalized anxiety disorder: Secondary | ICD-10-CM | POA: Diagnosis not present

## 2018-05-21 DIAGNOSIS — F909 Attention-deficit hyperactivity disorder, unspecified type: Secondary | ICD-10-CM | POA: Diagnosis not present

## 2018-06-18 DIAGNOSIS — J039 Acute tonsillitis, unspecified: Secondary | ICD-10-CM | POA: Diagnosis not present

## 2018-07-15 DIAGNOSIS — F909 Attention-deficit hyperactivity disorder, unspecified type: Secondary | ICD-10-CM | POA: Diagnosis not present

## 2018-07-15 DIAGNOSIS — F411 Generalized anxiety disorder: Secondary | ICD-10-CM | POA: Diagnosis not present

## 2018-07-15 DIAGNOSIS — F4310 Post-traumatic stress disorder, unspecified: Secondary | ICD-10-CM | POA: Diagnosis not present

## 2018-07-25 DIAGNOSIS — F411 Generalized anxiety disorder: Secondary | ICD-10-CM | POA: Diagnosis not present

## 2018-07-25 DIAGNOSIS — F332 Major depressive disorder, recurrent severe without psychotic features: Secondary | ICD-10-CM | POA: Diagnosis not present

## 2018-07-25 DIAGNOSIS — F4310 Post-traumatic stress disorder, unspecified: Secondary | ICD-10-CM | POA: Diagnosis not present

## 2018-09-11 DIAGNOSIS — Z205 Contact with and (suspected) exposure to viral hepatitis: Secondary | ICD-10-CM | POA: Diagnosis not present

## 2018-09-11 DIAGNOSIS — F411 Generalized anxiety disorder: Secondary | ICD-10-CM | POA: Diagnosis not present

## 2018-09-11 DIAGNOSIS — K089 Disorder of teeth and supporting structures, unspecified: Secondary | ICD-10-CM | POA: Diagnosis not present

## 2018-09-11 DIAGNOSIS — F331 Major depressive disorder, recurrent, moderate: Secondary | ICD-10-CM | POA: Diagnosis not present

## 2018-10-23 DIAGNOSIS — Z0000 Encounter for general adult medical examination without abnormal findings: Secondary | ICD-10-CM | POA: Diagnosis not present

## 2018-10-23 DIAGNOSIS — Z205 Contact with and (suspected) exposure to viral hepatitis: Secondary | ICD-10-CM | POA: Diagnosis not present

## 2018-10-29 DIAGNOSIS — Z124 Encounter for screening for malignant neoplasm of cervix: Secondary | ICD-10-CM | POA: Diagnosis not present

## 2018-10-29 DIAGNOSIS — Z0001 Encounter for general adult medical examination with abnormal findings: Secondary | ICD-10-CM | POA: Diagnosis not present

## 2018-10-29 DIAGNOSIS — M797 Fibromyalgia: Secondary | ICD-10-CM | POA: Diagnosis not present

## 2018-10-29 DIAGNOSIS — F909 Attention-deficit hyperactivity disorder, unspecified type: Secondary | ICD-10-CM | POA: Diagnosis not present

## 2018-10-29 DIAGNOSIS — F419 Anxiety disorder, unspecified: Secondary | ICD-10-CM | POA: Diagnosis not present

## 2018-10-29 DIAGNOSIS — G894 Chronic pain syndrome: Secondary | ICD-10-CM | POA: Diagnosis not present

## 2018-10-29 DIAGNOSIS — E782 Mixed hyperlipidemia: Secondary | ICD-10-CM | POA: Diagnosis not present

## 2018-10-29 DIAGNOSIS — Z01419 Encounter for gynecological examination (general) (routine) without abnormal findings: Secondary | ICD-10-CM | POA: Diagnosis not present

## 2018-10-29 DIAGNOSIS — F39 Unspecified mood [affective] disorder: Secondary | ICD-10-CM | POA: Diagnosis not present

## 2018-10-29 DIAGNOSIS — F411 Generalized anxiety disorder: Secondary | ICD-10-CM | POA: Diagnosis not present

## 2018-12-28 DIAGNOSIS — R5381 Other malaise: Secondary | ICD-10-CM | POA: Diagnosis not present

## 2018-12-28 DIAGNOSIS — R69 Illness, unspecified: Secondary | ICD-10-CM | POA: Diagnosis not present

## 2019-01-18 ENCOUNTER — Encounter (HOSPITAL_COMMUNITY): Payer: Self-pay | Admitting: Emergency Medicine

## 2019-01-18 ENCOUNTER — Other Ambulatory Visit: Payer: Self-pay

## 2019-01-18 ENCOUNTER — Emergency Department (HOSPITAL_COMMUNITY)
Admission: EM | Admit: 2019-01-18 | Discharge: 2019-01-18 | Disposition: A | Discharge status: Home / Self Care | Payer: BC Managed Care – PPO | Attending: Emergency Medicine | Admitting: Emergency Medicine

## 2019-01-18 ENCOUNTER — Emergency Department (HOSPITAL_COMMUNITY): Payer: BC Managed Care – PPO

## 2019-01-18 DIAGNOSIS — R55 Syncope and collapse: Secondary | ICD-10-CM | POA: Insufficient documentation

## 2019-01-18 DIAGNOSIS — F17210 Nicotine dependence, cigarettes, uncomplicated: Secondary | ICD-10-CM | POA: Insufficient documentation

## 2019-01-18 DIAGNOSIS — Z79899 Other long term (current) drug therapy: Secondary | ICD-10-CM | POA: Diagnosis not present

## 2019-01-18 DIAGNOSIS — F419 Anxiety disorder, unspecified: Secondary | ICD-10-CM | POA: Diagnosis not present

## 2019-01-18 DIAGNOSIS — R457 State of emotional shock and stress, unspecified: Secondary | ICD-10-CM | POA: Diagnosis not present

## 2019-01-18 DIAGNOSIS — R569 Unspecified convulsions: Secondary | ICD-10-CM | POA: Diagnosis not present

## 2019-01-18 DIAGNOSIS — S299XXA Unspecified injury of thorax, initial encounter: Secondary | ICD-10-CM | POA: Diagnosis not present

## 2019-01-18 DIAGNOSIS — G4089 Other seizures: Secondary | ICD-10-CM | POA: Diagnosis not present

## 2019-01-18 HISTORY — DX: Anxiety disorder, unspecified: F41.9

## 2019-01-18 HISTORY — DX: Attention-deficit hyperactivity disorder, unspecified type: F90.9

## 2019-01-18 LAB — COMPREHENSIVE METABOLIC PANEL
ALT: 16 U/L (ref 0–44)
AST: 14 U/L — ABNORMAL LOW (ref 15–41)
Albumin: 3.6 g/dL (ref 3.5–5.0)
Alkaline Phosphatase: 47 U/L (ref 38–126)
Anion gap: 9 (ref 5–15)
BUN: 6 mg/dL (ref 6–20)
CO2: 24 mmol/L (ref 22–32)
Calcium: 8.9 mg/dL (ref 8.9–10.3)
Chloride: 105 mmol/L (ref 98–111)
Creatinine, Ser: 0.68 mg/dL (ref 0.44–1.00)
GFR calc Af Amer: >60 mL/min (ref >60)
GFR calc non Af Amer: >60 mL/min (ref >60)
Glucose, Bld: 94 mg/dL (ref 70–99)
Potassium: 4.1 mmol/L (ref 3.5–5.1)
Sodium: 138 mmol/L (ref 135–145)
Total Bilirubin: 0.5 mg/dL (ref 0.3–1.2)
Total Protein: 6.4 g/dL — ABNORMAL LOW (ref 6.5–8.1)

## 2019-01-18 LAB — RAPID URINE DRUG SCREEN, HOSP PERFORMED
Amphetamines: NOT DETECTED
Barbiturates: NOT DETECTED
Benzodiazepines: POSITIVE — AB
Cocaine: NOT DETECTED
Opiates: NOT DETECTED
Tetrahydrocannabinol: NOT DETECTED

## 2019-01-18 LAB — CBC WITH DIFFERENTIAL/PLATELET
Abs Immature Granulocytes: 0.03 10*3/uL (ref 0.00–0.07)
Basophils Absolute: 0 10*3/uL (ref 0.0–0.1)
Basophils Relative: 0 %
Eosinophils Absolute: 0.1 10*3/uL (ref 0.0–0.5)
Eosinophils Relative: 2 %
HCT: 39.4 % (ref 36.0–46.0)
Hemoglobin: 12.7 g/dL (ref 12.0–15.0)
Immature Granulocytes: 0 %
Lymphocytes Relative: 29 %
Lymphs Abs: 2 10*3/uL (ref 0.7–4.0)
MCH: 30.3 pg (ref 26.0–34.0)
MCHC: 32.2 g/dL (ref 30.0–36.0)
MCV: 94 fL (ref 80.0–100.0)
Monocytes Absolute: 0.6 10*3/uL (ref 0.1–1.0)
Monocytes Relative: 8 %
Neutro Abs: 4.1 10*3/uL (ref 1.7–7.7)
Neutrophils Relative %: 61 %
Platelets: 276 10*3/uL (ref 150–400)
RBC: 4.19 MIL/uL (ref 3.87–5.11)
RDW: 12.4 % (ref 11.5–15.5)
WBC: 6.7 10*3/uL (ref 4.0–10.5)
nRBC: 0 % (ref 0.0–0.2)

## 2019-01-18 LAB — PREGNANCY, URINE: Preg Test, Ur: NEGATIVE

## 2019-01-18 LAB — URINALYSIS, ROUTINE W REFLEX MICROSCOPIC
Bilirubin Urine: NEGATIVE
Glucose, UA: NEGATIVE mg/dL
Hgb urine dipstick: NEGATIVE
Ketones, ur: NEGATIVE mg/dL
Leukocytes,Ua: NEGATIVE
Nitrite: NEGATIVE
Protein, ur: NEGATIVE mg/dL
Specific Gravity, Urine: 1.005 (ref 1.005–1.030)
pH: 8 (ref 5.0–8.0)

## 2019-01-18 LAB — LACTIC ACID, PLASMA: Lactic Acid, Venous: 0.9 mmol/L (ref 0.5–1.9)

## 2019-01-18 LAB — MAGNESIUM: Magnesium: 2 mg/dL (ref 1.7–2.4)

## 2019-01-18 IMAGING — CT CT HEAD W/O CM
4 of 8 series · 16 of 47 positions shown, 18 images · non-contrast
Comparison: None.

CLINICAL DATA: Anxiety pseudoseizures

EXAM:
CT HEAD WITHOUT CONTRAST
CT CERVICAL SPINE WITHOUT CONTRAST
TECHNIQUE: Multidetector CT imaging of the head and cervical spine was
performed following the standard protocol without intravenous
contrast. Multiplanar CT image reconstructions of the cervical spine
were also generated.

[Series 4: head bone · axial · 0.47mm/px · z∈[-64,+2]mm · 4 of 78 slices shown]
[im 12/78  bone]
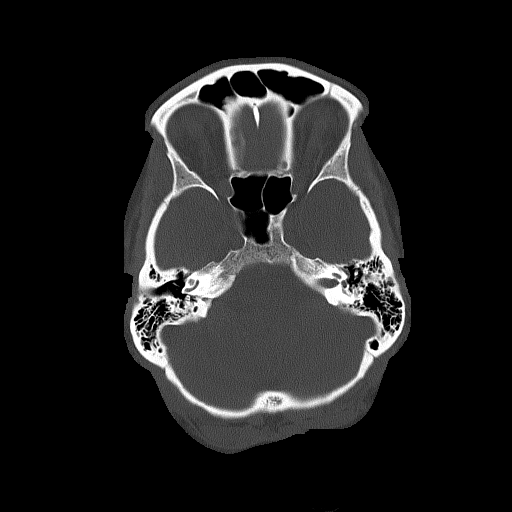
[im 23/78  bone]
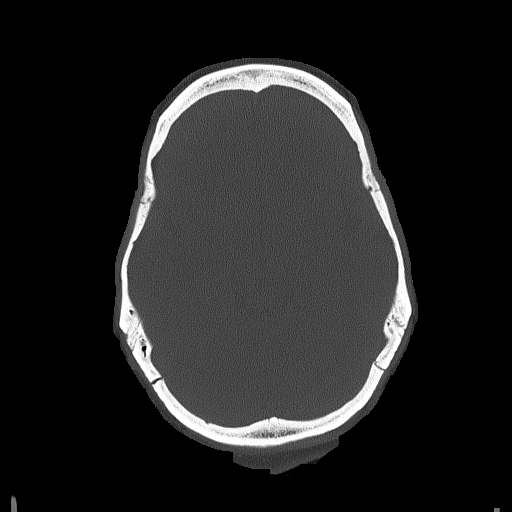
[im 34/78  bone]
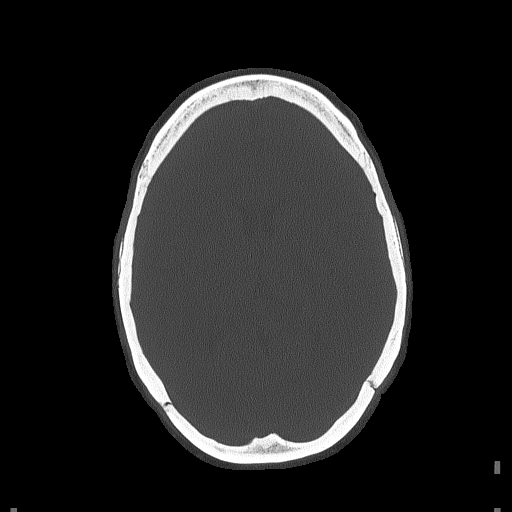
[im 45/78  bone]
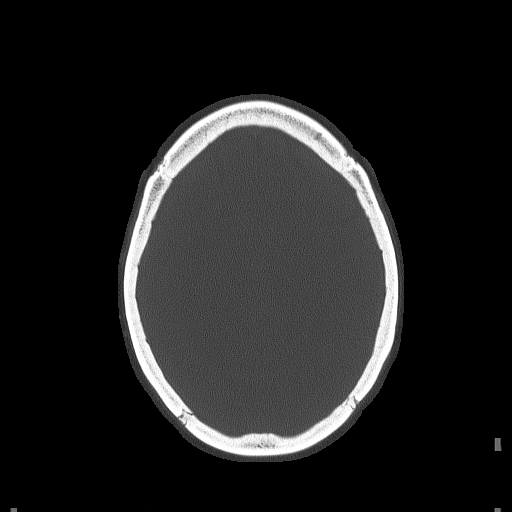

[Series 5: coronal soft · coronal · 0.30mm/px · 3 of 74 slices shown]
[im 25/74  brain]
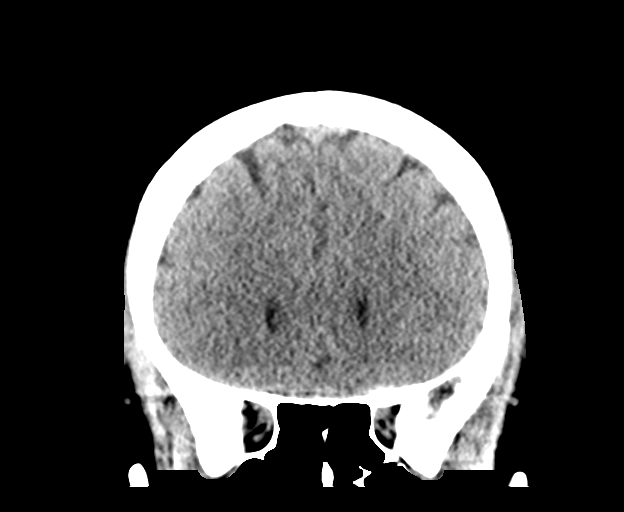
[im 37/74  brain]
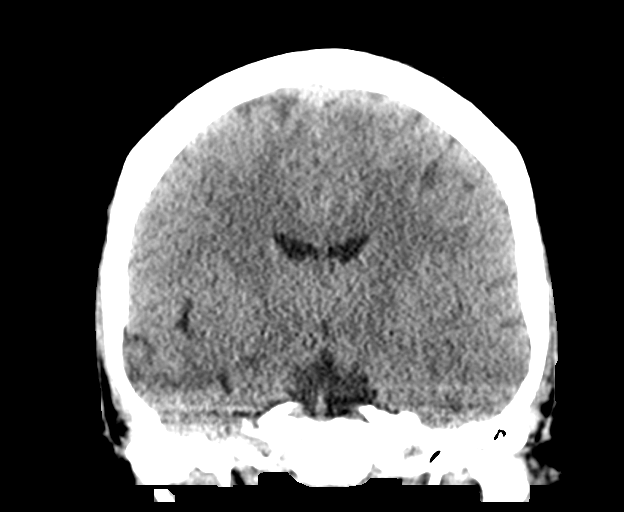
[im 49/74  brain]
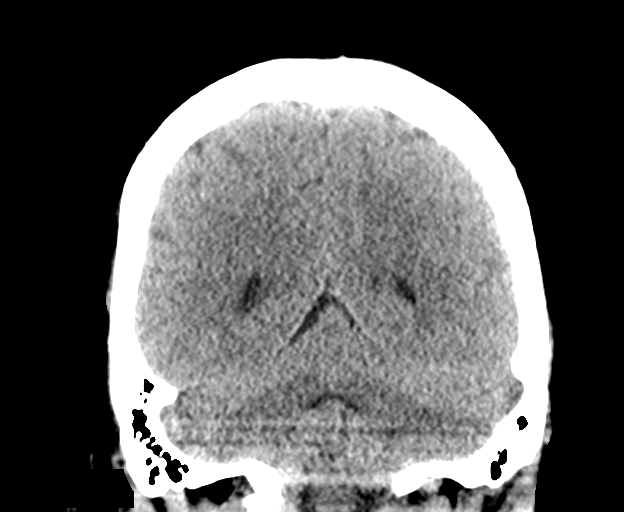

[Series 6: sagittal soft · sagittal · 0.30mm/px · 1 of 61 slices shown]
[im 31/61  brain]
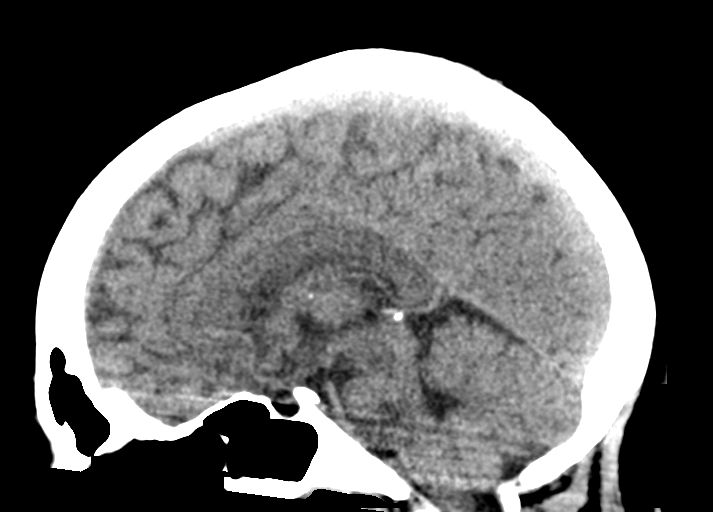

[Series 11: orthogonal axials · axial · 0.21mm/px · z∈[-239,-122]mm · 8 of 95 slices shown, 10 images]
[im 11/95  brain]
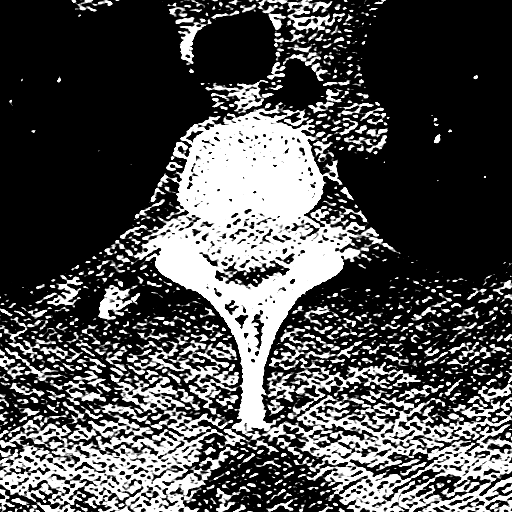
[im 11/95  bone]
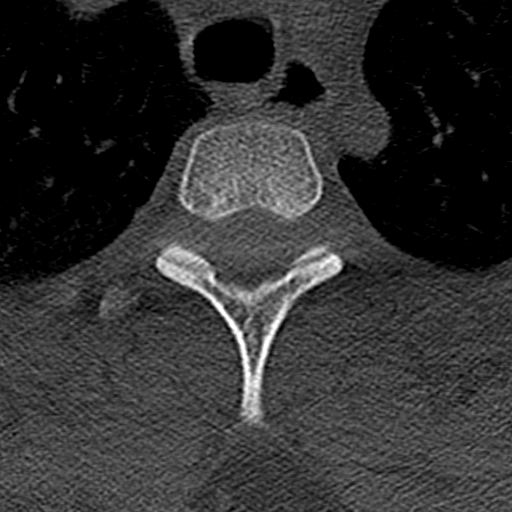
[im 21/95  brain]
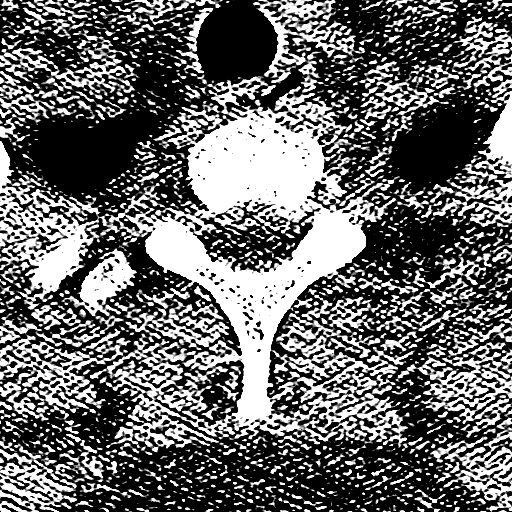
[im 32/95  brain]
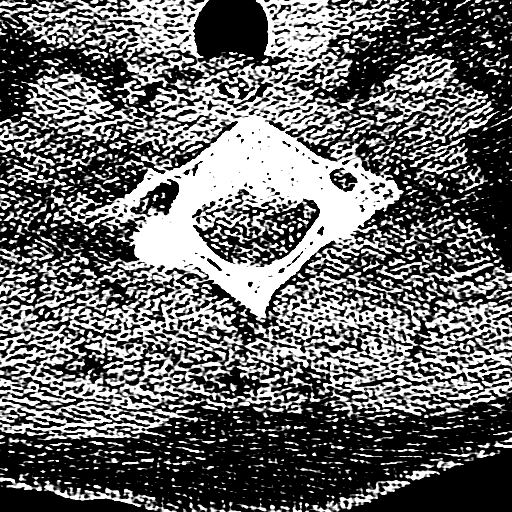
[im 42/95  brain]
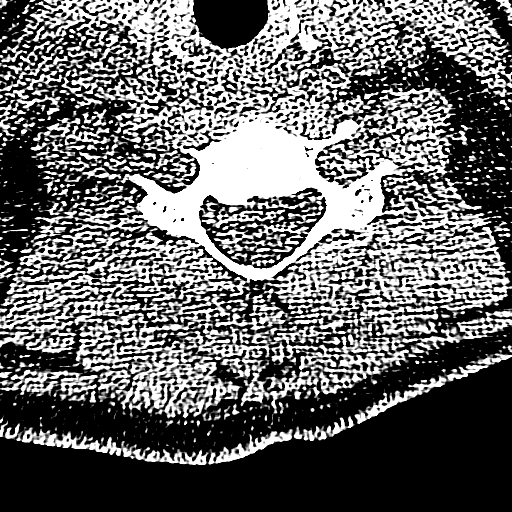
[im 53/95  brain]
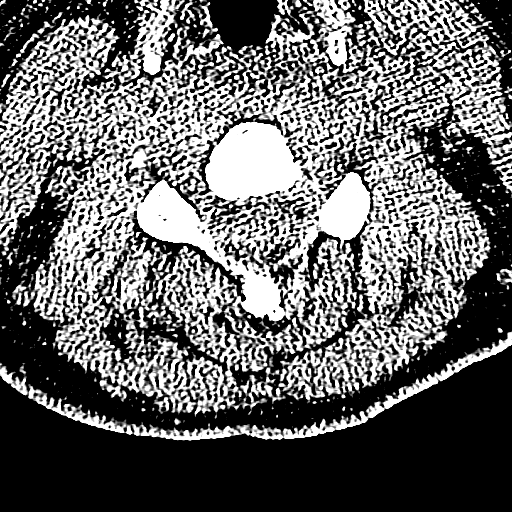
[im 53/95  bone]
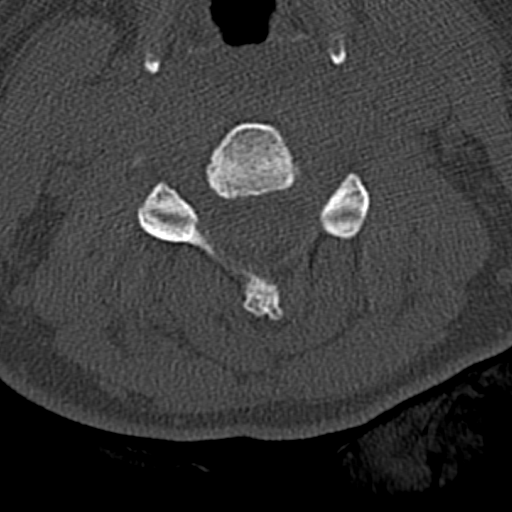
[im 63/95  brain]
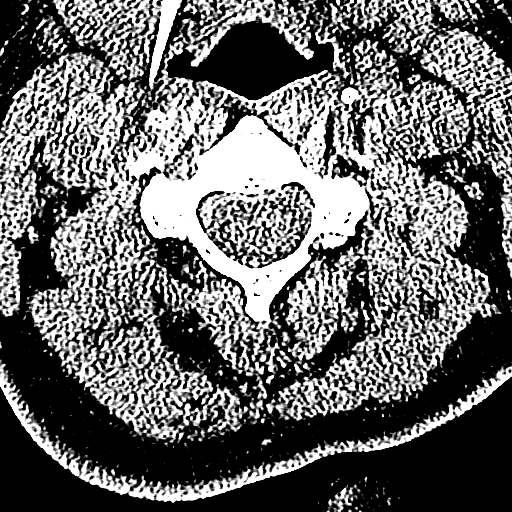
[im 74/95  brain]
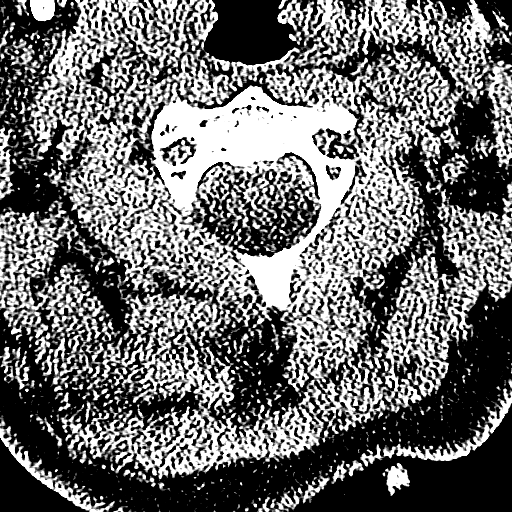
[im 84/95  brain]
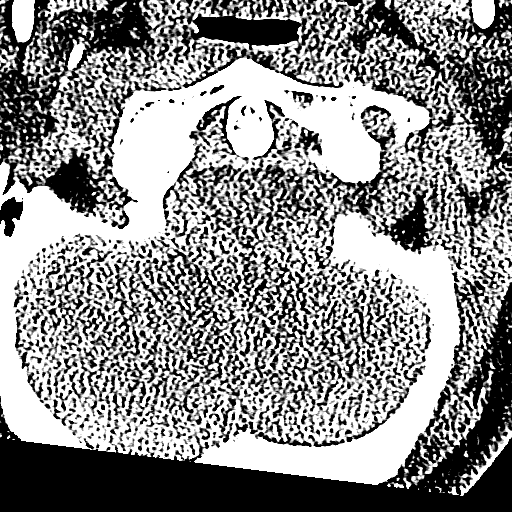

[16 of 47 positions shown; findings below may reference images not displayed]

FINDINGS: CT HEAD FINDINGS

Brain: No evidence of acute infarction, hemorrhage, hydrocephalus,
extra-axial collection or mass lesion/mass effect.

Vascular: No hyperdense vessel or unexpected calcification.

Skull: Normal. Negative for fracture or focal lesion.

Sinuses/Orbits: Mucosal thickening in the ethmoid sinuses

Other: None

CT CERVICAL SPINE FINDINGS

Alignment: Reversal of cervical lordosis. No subluxation. Facet
alignment within normal limits.

Skull base and vertebrae: No acute fracture. No primary bone lesion
or focal pathologic process.

Soft tissues and spinal canal: No prevertebral fluid or swelling. No
visible canal hematoma.

Disc levels:  Within normal limits

Upper chest: Negative.

Other: None
IMPRESSION: 1. Negative non contrasted CT appearance of the brain
2. Reversal of cervical lordosis.  No acute osseous abnormality

## 2019-01-18 IMAGING — DX DG CHEST 2V
2 series · 2 of 2 positions shown · non-contrast
Comparison: None.

CLINICAL DATA: Fall.  Syncope.

EXAM:
CHEST - 2 VIEW

[chest lat]
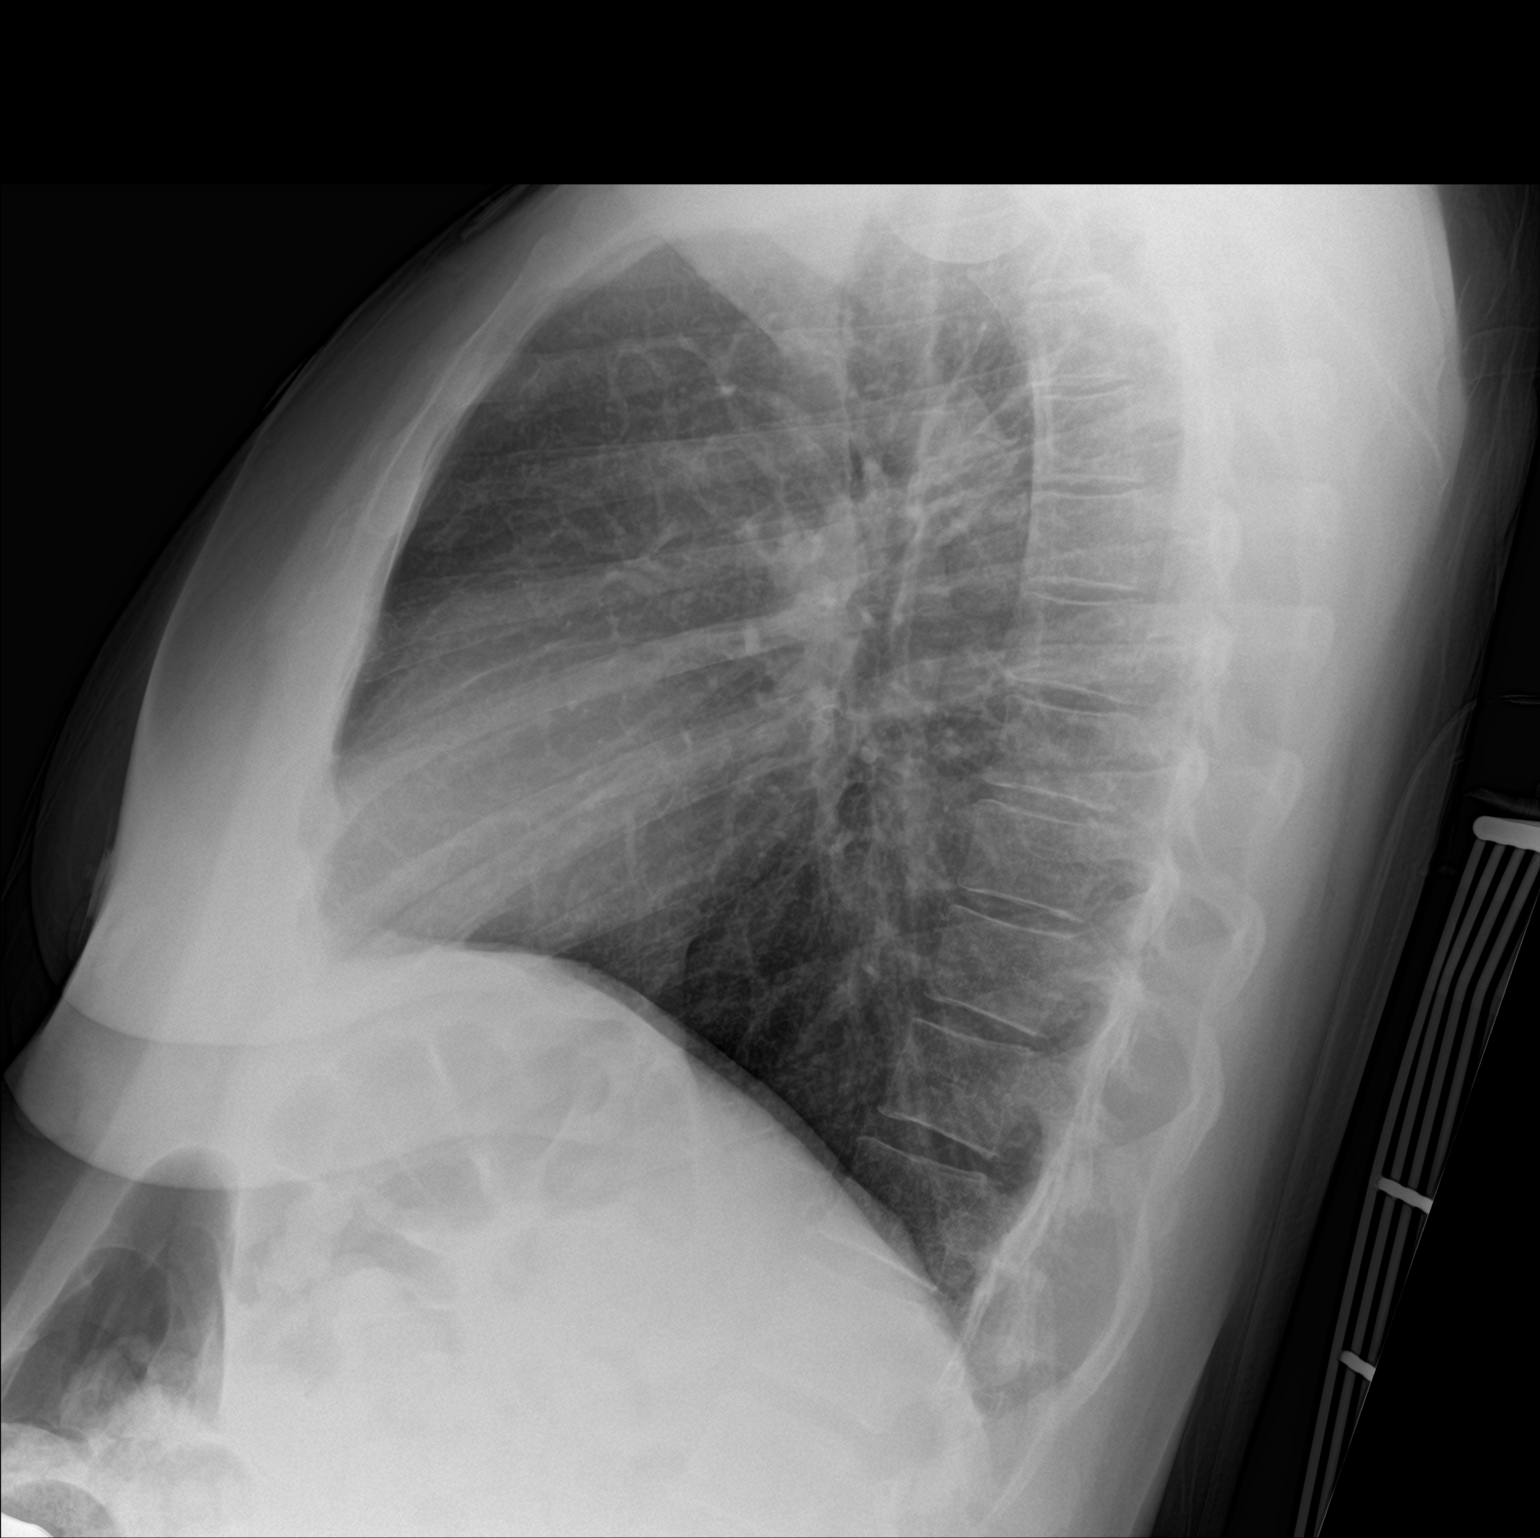

[chest ap]
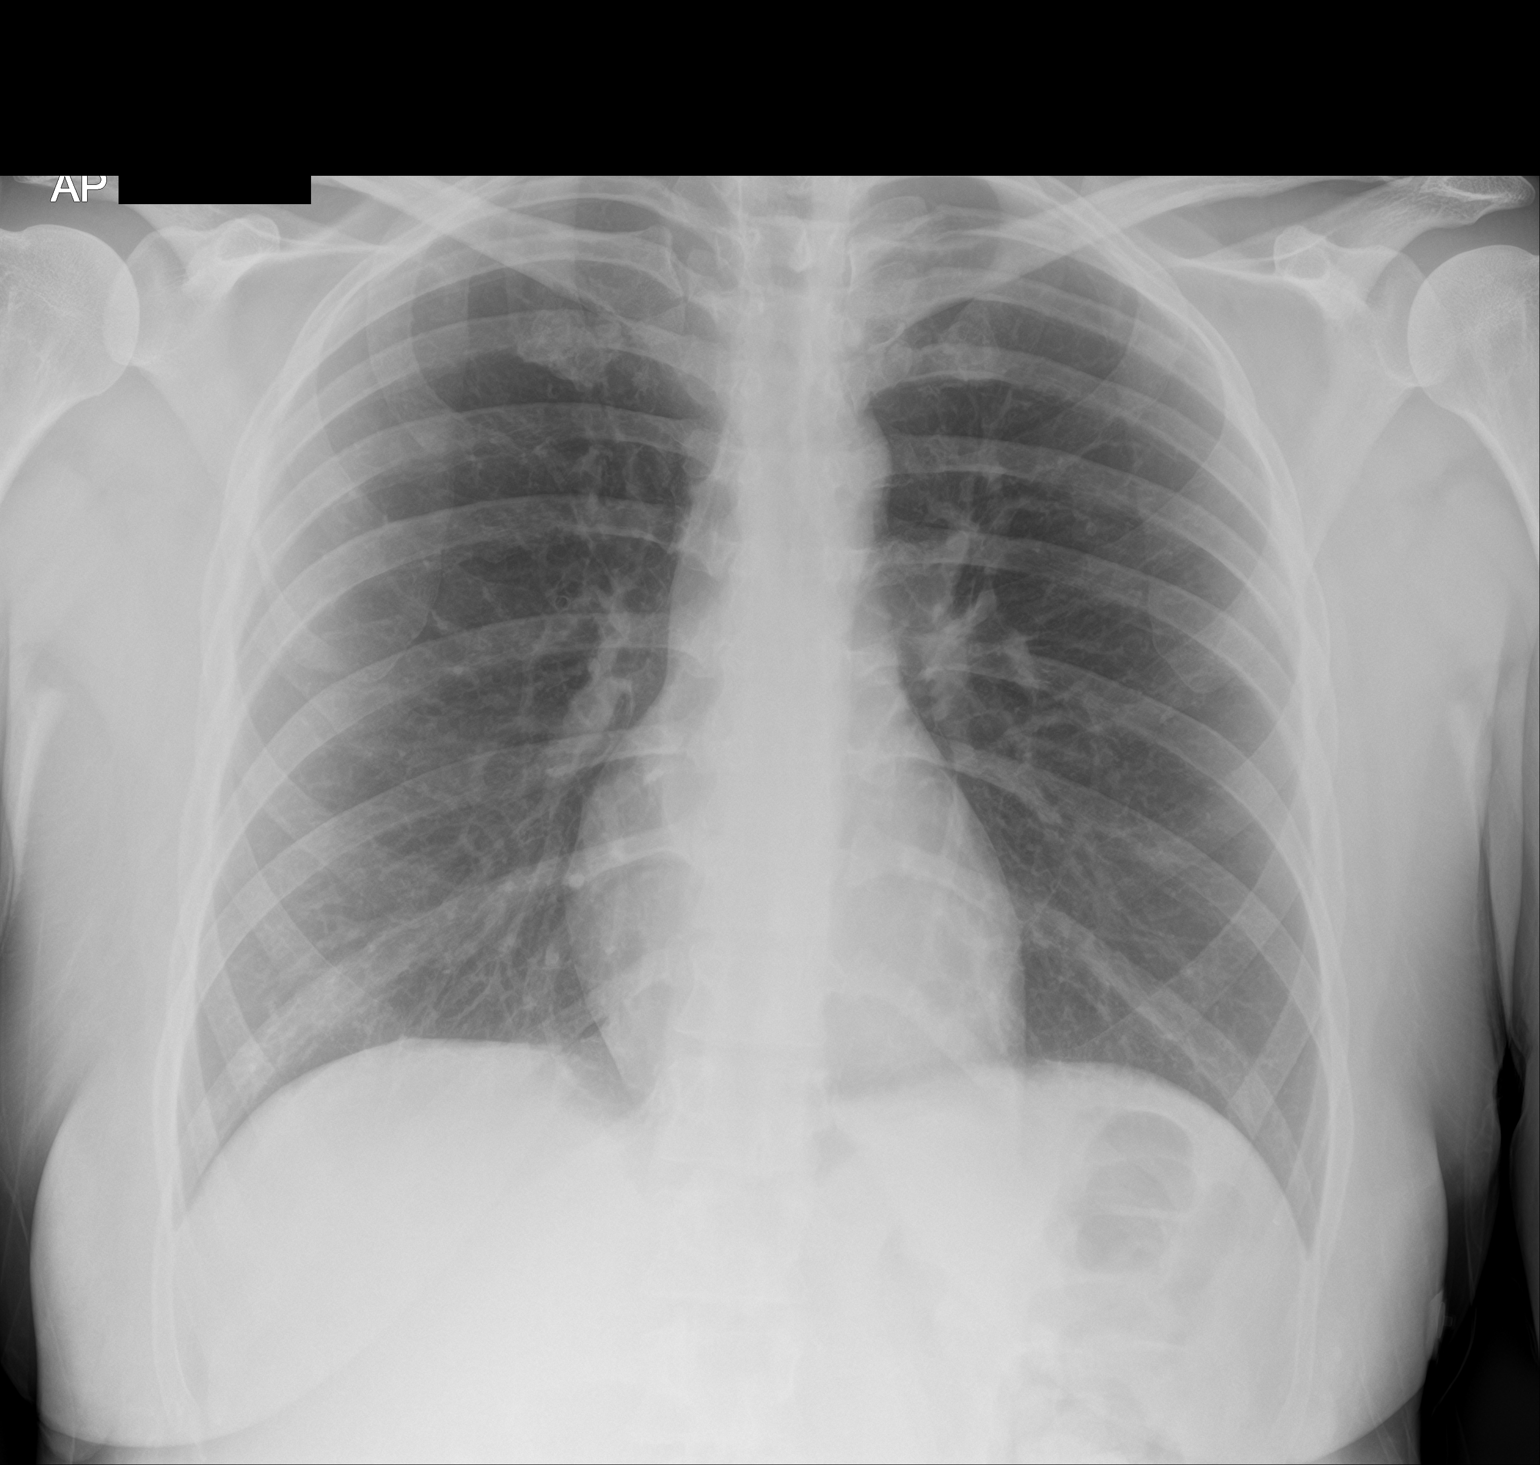

[2 of 2 positions shown; findings below may reference images not displayed]

FINDINGS: The heart size and mediastinal contours are within normal limits.
Both lungs are clear. The visualized skeletal structures are
unremarkable.
IMPRESSION: No active cardiopulmonary disease.

## 2019-01-18 IMAGING — CT CT CERVICAL SPINE W/O CM
5 of 8 series · 13 of 33 positions shown, 14 images · non-contrast
Comparison: None.

CLINICAL DATA: Anxiety pseudoseizures

EXAM:
CT HEAD WITHOUT CONTRAST
CT CERVICAL SPINE WITHOUT CONTRAST
TECHNIQUE: Multidetector CT imaging of the head and cervical spine was
performed following the standard protocol without intravenous
contrast. Multiplanar CT image reconstructions of the cervical spine
were also generated.

[Series 4: head bone · axial · 0.47mm/px · z∈[-36,+16]mm · 2 of 78 slices shown]
[im 26/78  bone]
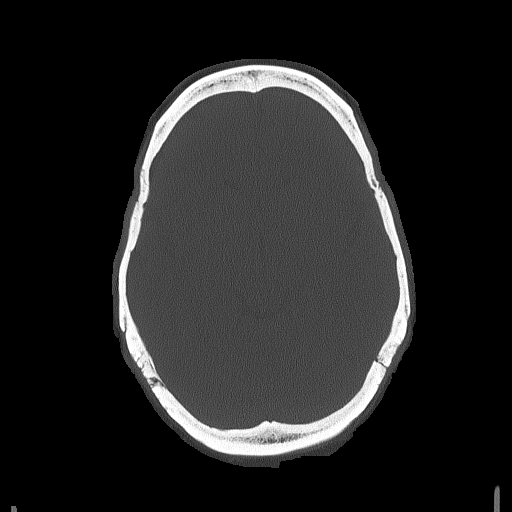
[im 52/78  bone]
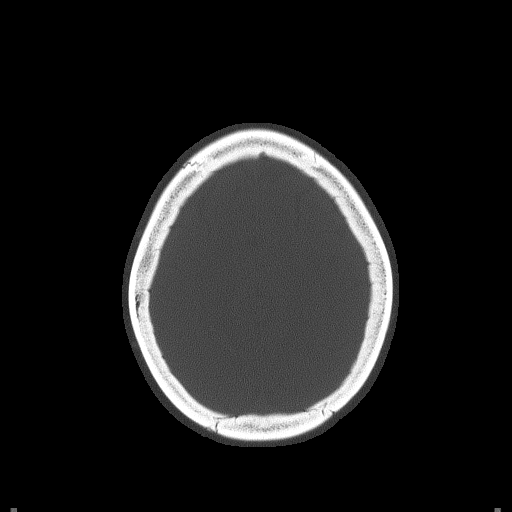

[Series 5: coronal soft · coronal · 0.30mm/px · 3 of 74 slices shown]
[im 19/74  bone]
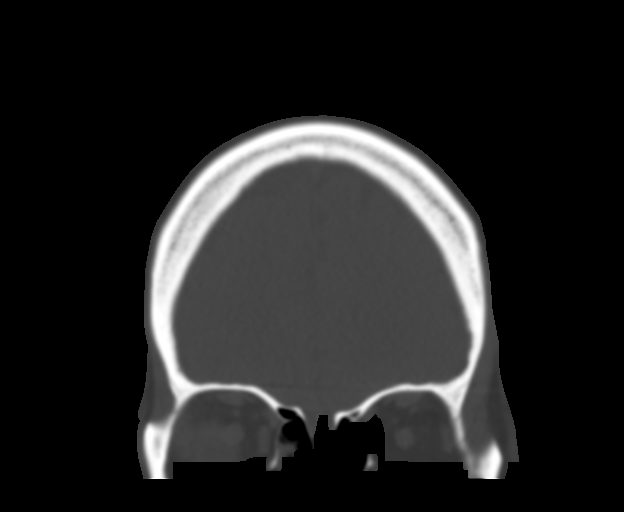
[im 37/74  bone]
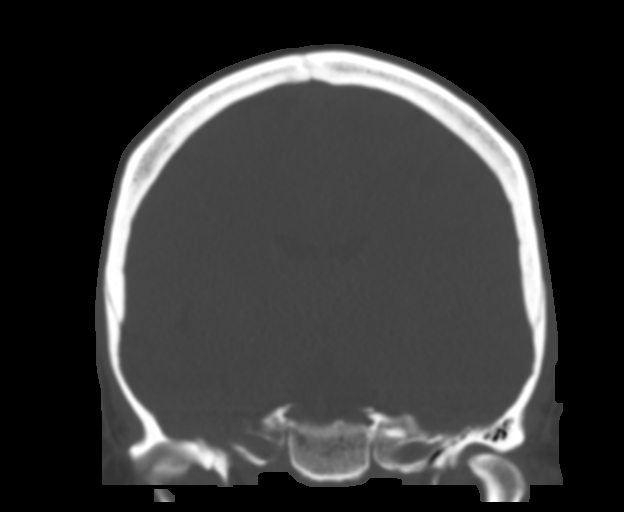
[im 55/74  bone]
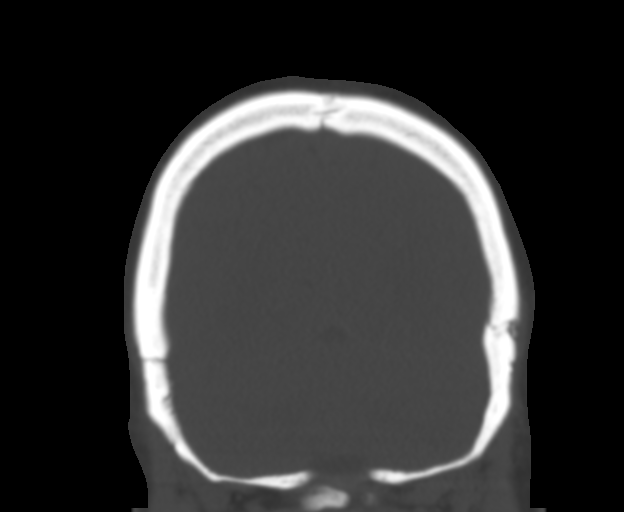

[Series 8: c spine soft · axial · 0.42mm/px · z∈[-188,-126]mm · 2 of 93 slices shown]
[im 31/93  soft-tissue]
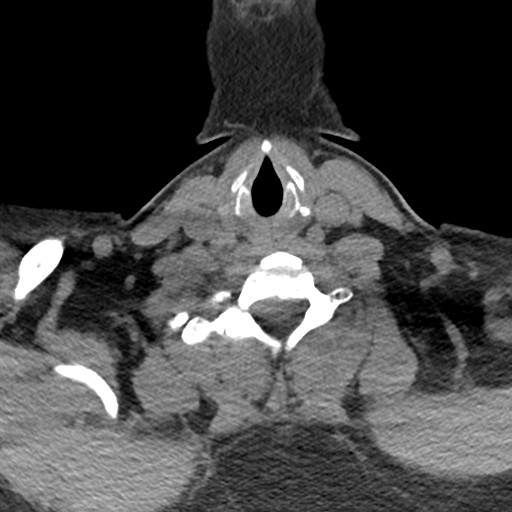
[im 62/93  soft-tissue]
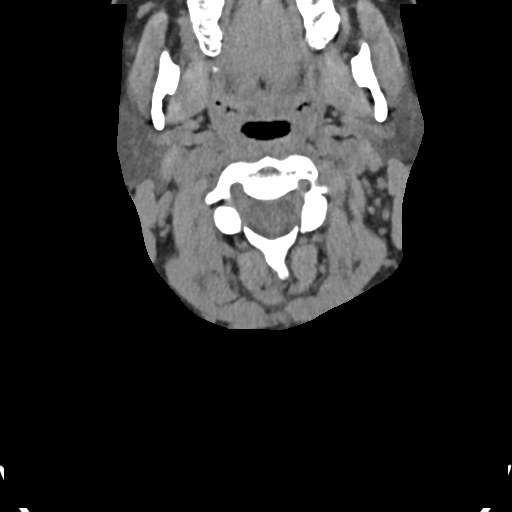

[Series 9: sagittal bone · sagittal · 0.27mm/px · 4 of 61 slices shown]
[im 13/61  bone]
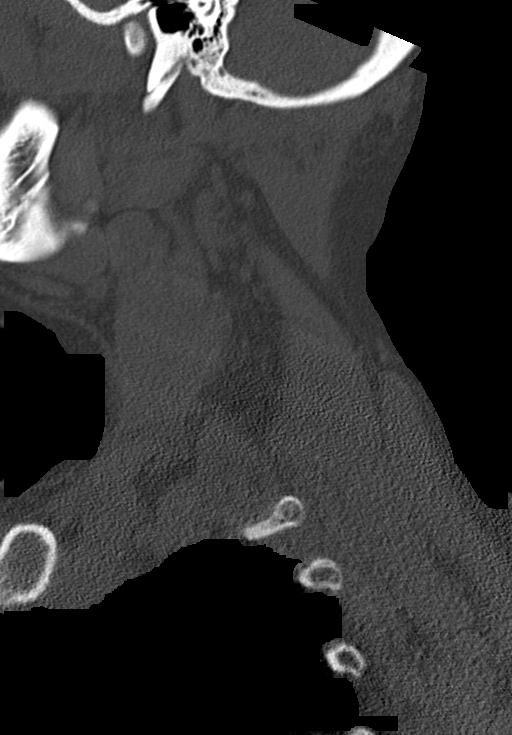
[im 25/61  bone]
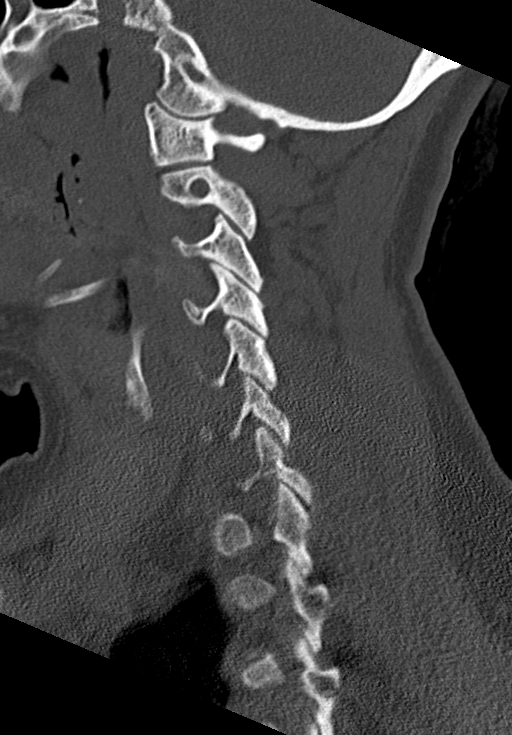
[im 37/61  bone]
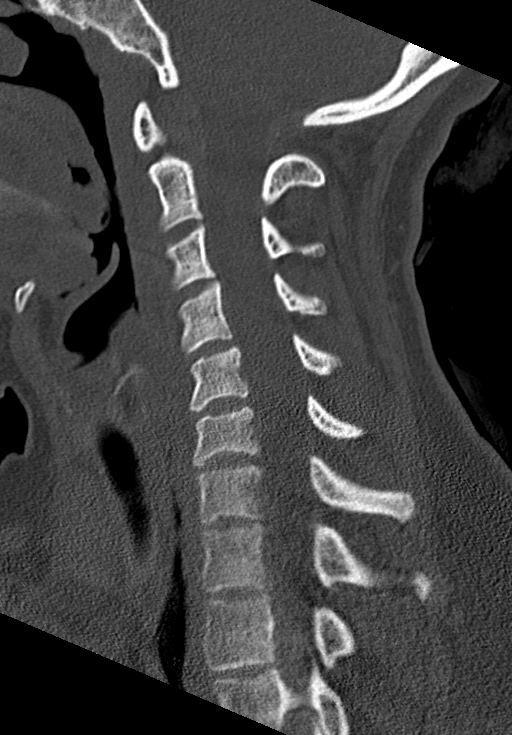
[im 49/61  bone]
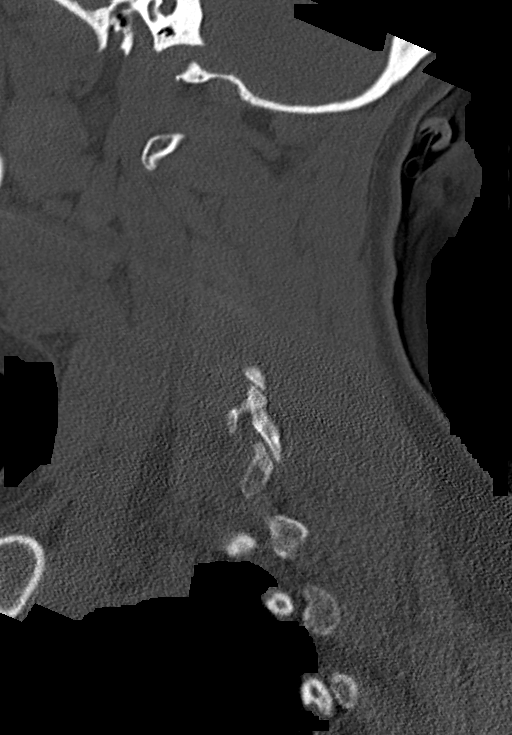

[Series 11: orthogonal axials · axial · 0.21mm/px · z∈[-208,-162]mm · 2 of 95 slices shown, 3 images]
[im 32/95  soft-tissue]
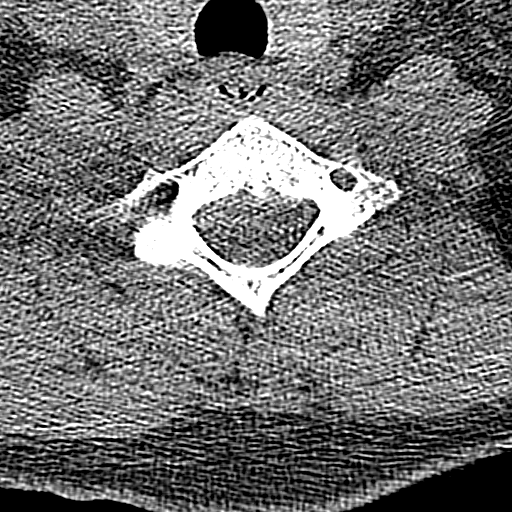
[im 32/95  bone]
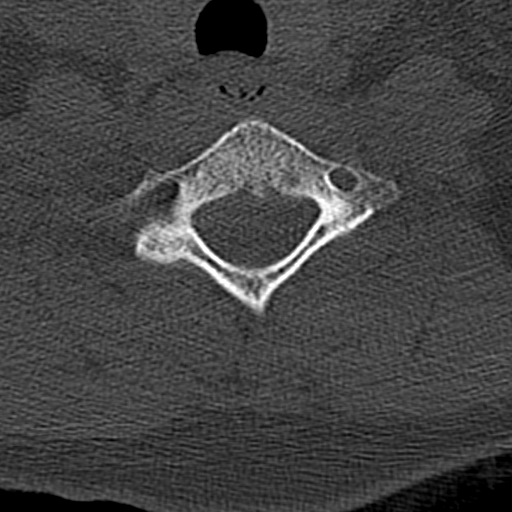
[im 63/95  bone]
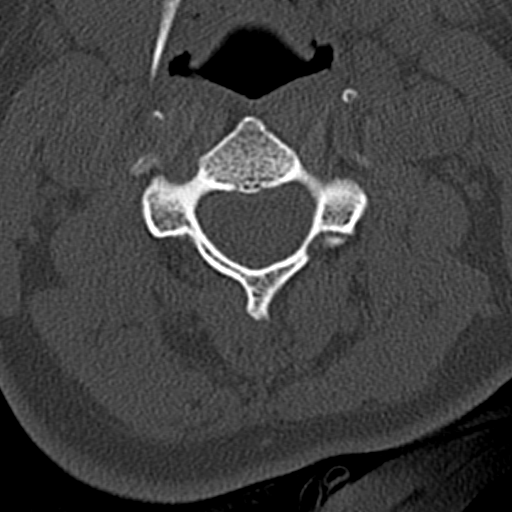

[13 of 33 positions shown; findings below may reference images not displayed]

FINDINGS: CT HEAD FINDINGS

Brain: No evidence of acute infarction, hemorrhage, hydrocephalus,
extra-axial collection or mass lesion/mass effect.

Vascular: No hyperdense vessel or unexpected calcification.

Skull: Normal. Negative for fracture or focal lesion.

Sinuses/Orbits: Mucosal thickening in the ethmoid sinuses

Other: None

CT CERVICAL SPINE FINDINGS

Alignment: Reversal of cervical lordosis. No subluxation. Facet
alignment within normal limits.

Skull base and vertebrae: No acute fracture. No primary bone lesion
or focal pathologic process.

Soft tissues and spinal canal: No prevertebral fluid or swelling. No
visible canal hematoma.

Disc levels:  Within normal limits

Upper chest: Negative.

Other: None
IMPRESSION: 1. Negative non contrasted CT appearance of the brain
2. Reversal of cervical lordosis.  No acute osseous abnormality

## 2019-01-18 MED ORDER — HYDROXYZINE HCL 50 MG/ML IM SOLN
50.0000 mg | Freq: Once | INTRAMUSCULAR | Status: AC
  Administered 2019-01-18: 50 mg via INTRAMUSCULAR
  Filled 2019-01-18: qty 1

## 2019-01-18 MED ORDER — HYDROXYZINE HCL 25 MG PO TABS: 25.0000 mg | ORAL_TABLET | Freq: Four times a day (QID) | ORAL | 0 refills | Status: AC

## 2019-01-18 MED ORDER — SODIUM CHLORIDE 0.9 % IV BOLUS (SEPSIS)
1000.0000 mL | Freq: Once | INTRAVENOUS | Status: AC
Start: 2019-01-18 — End: 2019-01-18
  Administered 2019-01-18: 1000 mL via INTRAVENOUS

## 2019-01-18 NOTE — ED Notes (Addendum)
Patient transported to CT 

## 2019-01-18 NOTE — ED Provider Notes (Signed)
[facility] EMERGENCY DEPARTMENT Provider Note   CSN: [id number] Arrival date & time: 01/18/19  1258     History   Chief Complaint Chief Complaint  Patient presents with   Anxiety    HPI Amy Schroeder is a 38 y.o. female.     Patient is a 38 year old female with past medical history of chronic back pain, anxiety, ADHD presenting to the emergency department for question of syncope versus seizure.  Patient has a longstanding history of anxiety with longstanding Xanax use.  She reports that she was under a lot of stress today arguing with her son who is a huge stressor in her life and all of a sudden collapsed.  Patient reports that this has happened in the past when she has been under a lot of stress.  She is unsure if this is a seizure or not.  Patient's husband is with her and states that the son came out and said that his mom had collapsed.  When the husband went out she was on her back on the porch and sitting herself up.  She seemed to be confused.  There was no tongue biting or urinary incontinence.  She is now currently at her baseline.  She reports that she is afraid she might of had a seizure from withdrawing from Xanax.  Patient was last given 90 pills, 30 days worth of her Xanax prescription on 22 August.  Reports that someone stole her remaining medicine and she does not have a new provider that we will prescribe her her Xanax.  Her last pill was 2 weeks ago.  Reports that she has had significant amounts anxiety that is affecting her everyday life since then.  She currently feels like she is sore all over her body.  Reports not eating and drinking very much and did not sleep at all last night.     Past Medical History:  Diagnosis Date   ADHD    Anxiety    Chronic back pain    Chronic neck pain    Chronic pain    Headache     There are no active problems to display for this patient.   Past Surgical History:  Procedure Laterality Date   TUBAL LIGATION        OB History   No obstetric history on file.      Home Medications    Prior to Admission medications   Medication Sig Start Date End Date Taking? Authorizing Provider  acetaminophen (TYLENOL) 500 MG tablet Take 500-650 mg by mouth daily as needed for mild pain or moderate pain.   Yes [provider]  ALPRAZolam Duanne Moron) 1 MG tablet Take 1 mg by mouth 4 (four) times daily. May take as needed    Yes [provider]  amphetamine-dextroamphetamine (ADDERALL) 30 MG tablet Take 30 mg by mouth 2 (two) times daily.   Yes [provider]  buprenorphine (SUBUTEX) 8 MG SUBL SL tablet Place 8 mg under the tongue 3 (three) times daily.   Yes [provider]  ibuprofen (ADVIL,MOTRIN) 200 MG tablet Take 400 mg by mouth daily as needed for mild pain or moderate pain.    Yes [provider]  Multiple Vitamin (MULTIVITAMIN WITH MINERALS) TABS tablet Take 1 tablet by mouth daily.   Yes [provider]  hydrOXYzine (ATARAX/VISTARIL) 25 MG tablet Take 1 tablet (25 mg total) by mouth every 6 (six) hours. 01/18/19 02/17/19  Alveria Apley, PA-C    Family History No  family history on file.  Social History Social History   Tobacco Use   Smoking status: Current Every Day Smoker    Packs/day: 0.50    Types: Cigarettes   Smokeless tobacco: Never Used  Substance Use Topics   Alcohol use: No   Drug use: No     Allergies   Neurontin [gabapentin] and Ultram [tramadol hcl]   Review of Systems Review of Systems  Constitutional: Positive for fatigue. Negative for appetite change, diaphoresis and fever.  Eyes: Negative for visual disturbance.  Respiratory: Negative for cough and shortness of breath.   Cardiovascular: Negative for chest pain.  Gastrointestinal: Negative for abdominal pain, nausea and vomiting.  Genitourinary: Negative for dysuria.  Musculoskeletal: Positive for myalgias and neck pain. Negative for arthralgias, back pain, gait  problem and neck stiffness.  Skin: Negative for rash and wound.  Allergic/Immunologic: Negative for immunocompromised state.  Neurological: Positive for seizures (Unclear) and syncope. Negative for dizziness, tremors, weakness, numbness and headaches.     Physical Exam Updated Vital Signs BP 101/71    Pulse 64    Temp 98.2 F (36.8 C) (Oral)    Resp 14    Ht 5\' 11"  (1.803 m)    Wt 81.6 kg    LMP 12/09/2018 (Approximate)    SpO2 100%    BMI 25.10 kg/m   Physical Exam Vitals signs and nursing note reviewed.  Constitutional:      General: She is not in acute distress.    Appearance: Normal appearance. She is not ill-appearing, toxic-appearing or diaphoretic.  HENT:     Head: Normocephalic.     Nose: Nose normal.     Mouth/Throat:     Mouth: Mucous membranes are moist.  Eyes:     Conjunctiva/sclera: Conjunctivae normal.  Neck:     Musculoskeletal: Full passive range of motion without pain. Muscular tenderness present. No edema or spinous process tenderness.  Cardiovascular:     Rate and Rhythm: Normal rate and regular rhythm.  Pulmonary:     Effort: Pulmonary effort is normal.  Abdominal:     General: Abdomen is flat. Bowel sounds are normal. There is no distension.     Tenderness: There is no abdominal tenderness.  Skin:    General: Skin is dry.  Neurological:     General: No focal deficit present.     Mental Status: She is alert.     Cranial Nerves: No cranial nerve deficit.     Sensory: No sensory deficit.     Motor: No weakness.  Psychiatric:        Mood and Affect: Mood normal.      ED Treatments / Results  Labs (all labs ordered are listed, but only abnormal results are displayed) Labs Reviewed  URINALYSIS, ROUTINE W REFLEX MICROSCOPIC - Abnormal; Notable for the following components:      Result Value   Color, Urine STRAW (*)    All other components within normal limits  RAPID URINE DRUG SCREEN, HOSP PERFORMED - Abnormal; Notable for the following  components:   Benzodiazepines POSITIVE (*)    All other components within normal limits  COMPREHENSIVE METABOLIC PANEL - Abnormal; Notable for the following components:   Total Protein 6.4 (*)    AST 14 (*)    All other components within normal limits  PREGNANCY, URINE  CBC WITH DIFFERENTIAL/PLATELET  LACTIC ACID, PLASMA  MAGNESIUM    EKG None  Radiology Dg Chest 2 View  Result Date: 01/18/2019 CLINICAL DATA:  Fall.  Syncope. EXAM: CHEST - 2 VIEW COMPARISON:  None. FINDINGS: The heart size and mediastinal contours are within normal limits. Both lungs are clear. The visualized skeletal structures are unremarkable. IMPRESSION: No active cardiopulmonary disease. Electronically Signed   By: Kennith Center M.D.   On: 01/18/2019 15:57   Ct Head Wo Contrast  Result Date: 01/18/2019 CLINICAL DATA:  Anxiety pseudoseizures EXAM: CT HEAD WITHOUT CONTRAST CT CERVICAL SPINE WITHOUT CONTRAST TECHNIQUE: Multidetector CT imaging of the head and cervical spine was performed following the standard protocol without intravenous contrast. Multiplanar CT image reconstructions of the cervical spine were also generated. COMPARISON:  None. FINDINGS: CT HEAD FINDINGS Brain: No evidence of acute infarction, hemorrhage, hydrocephalus, extra-axial collection or mass lesion/mass effect. Vascular: No hyperdense vessel or unexpected calcification. Skull: Normal. Negative for fracture or focal lesion. Sinuses/Orbits: Mucosal thickening in the ethmoid sinuses Other: None CT CERVICAL SPINE FINDINGS Alignment: Reversal of cervical lordosis. No subluxation. Facet alignment within normal limits. Skull base and vertebrae: No acute fracture. No primary bone lesion or focal pathologic process. Soft tissues and spinal canal: No prevertebral fluid or swelling. No visible canal hematoma. Disc levels:  Within normal limits Upper chest: Negative. Other: None IMPRESSION: 1. Negative non contrasted CT appearance of the brain 2. Reversal of  cervical lordosis.  No acute osseous abnormality Electronically Signed   By: Jasmine Pang M.D.   On: 01/18/2019 16:12   Ct Cervical Spine Wo Contrast  Result Date: 01/18/2019 CLINICAL DATA:  Anxiety pseudoseizures EXAM: CT HEAD WITHOUT CONTRAST CT CERVICAL SPINE WITHOUT CONTRAST TECHNIQUE: Multidetector CT imaging of the head and cervical spine was performed following the standard protocol without intravenous contrast. Multiplanar CT image reconstructions of the cervical spine were also generated. COMPARISON:  None. FINDINGS: CT HEAD FINDINGS Brain: No evidence of acute infarction, hemorrhage, hydrocephalus, extra-axial collection or mass lesion/mass effect. Vascular: No hyperdense vessel or unexpected calcification. Skull: Normal. Negative for fracture or focal lesion. Sinuses/Orbits: Mucosal thickening in the ethmoid sinuses Other: None CT CERVICAL SPINE FINDINGS Alignment: Reversal of cervical lordosis. No subluxation. Facet alignment within normal limits. Skull base and vertebrae: No acute fracture. No primary bone lesion or focal pathologic process. Soft tissues and spinal canal: No prevertebral fluid or swelling. No visible canal hematoma. Disc levels:  Within normal limits Upper chest: Negative. Other: None IMPRESSION: 1. Negative non contrasted CT appearance of the brain 2. Reversal of cervical lordosis.  No acute osseous abnormality Electronically Signed   By: Jasmine Pang M.D.   On: 01/18/2019 16:12    Procedures Procedures (including critical care time)  Medications Ordered in ED Medications  sodium chloride 0.9 % bolus 1,000 mL (0 mLs Intravenous Stopped 01/18/19 1534)  hydrOXYzine (VISTARIL) injection 50 mg (50 mg Intramuscular Given 01/18/19 1643)     Initial Impression / Assessment and Plan / ED Course  I have reviewed the triage vital signs and the nursing notes.  Pertinent labs & imaging results that were available during my care of the patient were reviewed by me and considered  in my medical decision making (see chart for details).  Clinical Course as of Jan 17 1649  Sat Jan 18, 2019  1457 Patient presenting with question of anxiety reaction versus benzo withdrawal seizure versus pseudoseizure. Patient received 90 pills of xanax on 8/22 but reports hasn't had any left in 2 weeks. Episode today was witnessed by her young son and no seizure like activity noted. Has hx of similar reactions in the past related to anxiety and stress.  Not post ictal on my exam. Will workup with labs and CT.    [KM]  1620 I think likely this was an anxiety reaction rather than withdrawal seizure. Patient lactic acid is normal. She actually has benzos in her system despite stating her last dose was 2 weeks ago.  Her remaining workup is normal. She was given a liter of fluids and is at her baseline. She complains of anxiety that is severe for 50mg  IM.  I discussed the case with Dr. Gustavus MessingZukowski.  We agree we will not give her any benzodiazepines.  I have given her several resources for follow-up for new psychiatrist.  I have given her prescription for hydroxyzine.   [KM]    Clinical Course User Index [KM] Arlyn DunningMcLean, Carnel Stegman A, PA-C       Based on review of vitals, medical screening exam, lab work and/or imaging, there does not appear to be an acute, emergent etiology for the patient's symptoms. Counseled pt on good return precautions and encouraged both PCP and ED follow-up as needed.  Prior to discharge, I also discussed incidental imaging findings with patient in detail and advised appropriate, recommended follow-up in detail.  Clinical Impression: 1. Syncope, unspecified syncope type   2. Anxiety     Disposition: Discharge  Prior to providing a prescription for a controlled substance, I independently reviewed the patient's recent prescription history on the [place] Controlled Substance Reporting System. The patient had no recent or regular prescriptions and was deemed appropriate for a  brief, less than 3 day prescription of narcotic for acute analgesia.  This note was prepared with assistance of Conservation officer, historic buildingsDragon voice recognition software. Occasional wrong-word or sound-a-like substitutions may have occurred due to the inherent limitations of voice recognition software.   Final Clinical Impressions(s) / ED Diagnoses   Final diagnoses:  Syncope, unspecified syncope type  Anxiety    ED Discharge Orders         Ordered    hydrOXYzine (ATARAX/VISTARIL) 25 MG tablet  Every 6 hours     01/18/19 1641           Jeral PinchMcLean, Nowell Sites A, PA-C 01/18/19 1650    Samuel JesterMcManus, Kathleen, DO 01/21/19 1104

## 2019-01-18 NOTE — Discharge Instructions (Signed)
Please try the centers above for further psychiatric care

## 2019-01-18 NOTE — ED Triage Notes (Addendum)
Per EMS, pt here for evaluation for worsening anxiety. Pt has been out of her xanax adderall since end of August. EMS reports pt may have been having pseudo-seizures at home. CBG 128 per EMS.

## 2019-03-25 DIAGNOSIS — F4310 Post-traumatic stress disorder, unspecified: Secondary | ICD-10-CM | POA: Diagnosis not present

## 2019-03-25 DIAGNOSIS — F410 Panic disorder [episodic paroxysmal anxiety] without agoraphobia: Secondary | ICD-10-CM | POA: Diagnosis not present

## 2019-03-25 DIAGNOSIS — F900 Attention-deficit hyperactivity disorder, predominantly inattentive type: Secondary | ICD-10-CM | POA: Diagnosis not present

## 2019-03-28 ENCOUNTER — Other Ambulatory Visit: Payer: Self-pay

## 2019-03-28 DIAGNOSIS — Z20822 Contact with and (suspected) exposure to covid-19: Secondary | ICD-10-CM

## 2019-03-31 LAB — NOVEL CORONAVIRUS, NAA: SARS-CoV-2, NAA: NOT DETECTED

## 2019-08-07 ENCOUNTER — Ambulatory Visit: Payer: Medicaid Other | Attending: Internal Medicine

## 2019-08-07 ENCOUNTER — Other Ambulatory Visit: Payer: Self-pay

## 2019-08-07 DIAGNOSIS — Z23 Encounter for immunization: Secondary | ICD-10-CM

## 2019-08-07 NOTE — Progress Notes (Signed)
   Covid-19 Vaccination Clinic  Name:  Amy Schroeder    MRN: [id number] DOB: 20-Jul-1980  08/07/2019  [name] was observed post Covid-19 immunization for 15 minutes without incident. She was provided with Vaccine Information Sheet and instruction to access the V-Safe system.   [name] was instructed to call 911 with any severe reactions post vaccine: Marland Kitchen Difficulty breathing  . Swelling of face and throat  . A fast heartbeat  . A bad rash all over body  . Dizziness and weakness   Immunizations Administered    Name Date Dose VIS Date Route   Moderna COVID-19 Vaccine 08/07/2019 11:54 AM 0.5 mL 04/08/2019 Intramuscular   Manufacturer: Moderna   Lot: [id number]   NDC: 96759-163-84

## 2019-08-18 ENCOUNTER — Telehealth: Payer: Self-pay | Admitting: Women's Health

## 2019-08-18 NOTE — Telephone Encounter (Signed)

## 2019-08-20 ENCOUNTER — Encounter: Payer: BC Managed Care – PPO | Admitting: Women's Health

## 2019-09-09 ENCOUNTER — Ambulatory Visit: Payer: Medicaid Other | Attending: Internal Medicine

## 2019-09-09 DIAGNOSIS — Z23 Encounter for immunization: Secondary | ICD-10-CM

## 2019-09-09 NOTE — Progress Notes (Signed)
   Covid-19 Vaccination Clinic  Name:  Amy Schroeder    MRN: [id number] DOB: 1980-05-20  09/09/2019  Amy Schroeder was observed post Covid-19 immunization for 15 minutes without incident. She was provided with Vaccine Information Sheet and instruction to access the V-Safe system.   Amy Schroeder was instructed to call 911 with any severe reactions post vaccine: Marland Kitchen Difficulty breathing  . Swelling of face and throat  . A fast heartbeat  . A bad rash all over body  . Dizziness and weakness   Immunizations Administered    Name Date Dose VIS Date Route   Moderna COVID-19 Vaccine 09/09/2019 11:24 AM 0.5 mL 04/2019 Intramuscular   Manufacturer: Moderna   Lot: [id number]   NDC: 49179-150-56

## 2019-10-20 ENCOUNTER — Ambulatory Visit
Admission: EM | Admit: 2019-10-20 | Discharge: 2019-10-20 | Disposition: A | Discharge status: Home / Self Care | Payer: 59

## 2019-10-20 ENCOUNTER — Encounter: Payer: Self-pay | Admitting: Emergency Medicine

## 2019-10-20 ENCOUNTER — Other Ambulatory Visit: Payer: Self-pay

## 2019-10-20 DIAGNOSIS — R21 Rash and other nonspecific skin eruption: Secondary | ICD-10-CM

## 2019-10-20 DIAGNOSIS — B370 Candidal stomatitis: Secondary | ICD-10-CM

## 2019-10-20 DIAGNOSIS — Z1152 Encounter for screening for COVID-19: Secondary | ICD-10-CM | POA: Diagnosis not present

## 2019-10-20 DIAGNOSIS — R509 Fever, unspecified: Secondary | ICD-10-CM

## 2019-10-20 DIAGNOSIS — W57XXXA Bitten or stung by nonvenomous insect and other nonvenomous arthropods, initial encounter: Secondary | ICD-10-CM | POA: Diagnosis not present

## 2019-10-20 LAB — POCT RAPID STREP A (OFFICE): Rapid Strep A Screen: NEGATIVE

## 2019-10-20 MED ORDER — FLUCONAZOLE 150 MG PO TABS
150.0000 mg | ORAL_TABLET | Freq: Every day | ORAL | 0 refills | Status: DC
Start: 2019-10-20 — End: 2020-01-22

## 2019-10-20 MED ORDER — DOXYCYCLINE HYCLATE 100 MG PO CAPS: 100.0000 mg | ORAL_CAPSULE | Freq: Two times a day (BID) | ORAL | 0 refills | Status: AC

## 2019-10-20 MED ORDER — NYSTATIN 100000 UNIT/ML MT SUSP
500000.0000 [IU] | Freq: Four times a day (QID) | OROMUCOSAL | 0 refills | Status: DC
Start: 2019-10-20 — End: 2020-01-22

## 2019-10-20 NOTE — ED Provider Notes (Addendum)
[facility]    CSN: [id number] Arrival date & time: 10/20/19  1055      History   Chief Complaint Chief Complaint  Patient presents with  . Insect Bite    HPI Amy Schroeder is a 39 y.o. female.   Who presented to the urgent care with a complaint of take and unknown insect bite for the past few days.  Localized bite to lower extremities and external vagina.  States they have removed multiple take on her lower extremities the past few days.  Denies previous history of tick bite.  States she is having upper respiratory  symptoms.  Has tried Mucinex and Tylenol without relief.  Denies alleviating factors.  Denies nausea, vomiting, headache, dizziness, weakness, fatigue, abdominal pain  The history is provided by the patient. No language interpreter was used.    Past Medical History:  Diagnosis Date  . ADHD   . Anxiety   . Chronic back pain   . Chronic neck pain   . Chronic pain   . Headache     There are no problems to display for this patient.   Past Surgical History:  Procedure Laterality Date  . TUBAL LIGATION      OB History   No obstetric history on file.      Home Medications    Prior to Admission medications   Medication Sig Start Date End Date Taking? Authorizing Provider  acetaminophen (TYLENOL) 500 MG tablet Take 500-650 mg by mouth daily as needed for mild pain or moderate pain.   Yes [provider]  ALPRAZolam Prudy Feeler) 1 MG tablet Take 1 mg by mouth 4 (four) times daily. May take as needed    Yes [provider]  amphetamine-dextroamphetamine (ADDERALL) 30 MG tablet Take 30 mg by mouth 2 (two) times daily.   Yes [provider]  buprenorphine (SUBUTEX) 8 MG SUBL SL tablet Place 8 mg under the tongue 3 (three) times daily.   Yes [provider]  ibuprofen (ADVIL,MOTRIN) 200 MG tablet Take 400 mg by mouth daily as needed for mild pain or moderate pain.    Yes [provider]  Multiple Vitamin  (MULTIVITAMIN WITH MINERALS) TABS tablet Take 1 tablet by mouth daily.   Yes [provider]  vitamin B-12 (CYANOCOBALAMIN) 500 MCG tablet Take 500 mcg by mouth daily.   Yes [provider]  doxycycline (VIBRAMYCIN) 100 MG capsule Take 1 capsule (100 mg total) by mouth 2 (two) times daily for 14 days. 10/20/19 11/03/19  Dinnis Rog, Zachery Dakins, FNP  fluconazole (DIFLUCAN) 150 MG tablet Take 1 tablet (150 mg total) by mouth daily. Take second dose 72 hours after the first dose if symptom does not resolve 10/20/19   Johnta Couts, Zachery Dakins, FNP  nystatin (MYCOSTATIN) 100000 UNIT/ML suspension Take 5 mLs (500,000 Units total) by mouth 4 (four) times daily. 10/20/19   Temperence Zenor, Zachery Dakins, FNP    Family History No family history on file.  Social History Social History   Tobacco Use  . Smoking status: Current Every Day Smoker    Packs/day: 0.50    Types: Cigarettes  . Smokeless tobacco: Never Used  Vaping Use  . Vaping Use: Never used  Substance Use Topics  . Alcohol use: No  . Drug use: No     Allergies   Neurontin [gabapentin] and Ultram [tramadol hcl]   Review of Systems Review of Systems  Constitutional: Positive for fever.  Respiratory: Positive for cough.   Cardiovascular: Negative.  Musculoskeletal: Negative.   Skin: Positive for color change and rash.  All other systems reviewed and are negative.    Physical Exam Triage Vital Signs ED Triage Vitals  Enc Vitals Group     BP 10/20/19 1141 121/75     Pulse Rate 10/20/19 1141 95     Resp 10/20/19 1141 17     Temp 10/20/19 1141 (!) 100.5 F (38.1 C)     Temp Source 10/20/19 1141 Oral     SpO2 10/20/19 1141 98 %     Weight 10/20/19 1145 178 lb 9.2 oz (81 kg)     Height 10/20/19 1145 5\' 11"  (1.803 m)     Head Circumference --      Peak Flow --      Pain Score 10/20/19 1155 0     Pain Loc --      Pain Edu? --      Excl. in GC? --    No data found.  Updated Vital Signs BP 121/75 (BP Location: Right Arm)    Pulse 95   Temp (!) 100.5 F (38.1 C) (Oral)   Resp 17   Ht 5\' 11"  (1.803 m)   Wt 178 lb 9.2 oz (81 kg)   SpO2 98%   BMI 24.91 kg/m   Visual Acuity Right Eye Distance:   Left Eye Distance:   Bilateral Distance:    Right Eye Near:   Left Eye Near:    Bilateral Near:     Physical Exam Vitals and nursing note reviewed.  Constitutional:      General: She is not in acute distress.    Appearance: Normal appearance. She is normal weight. She is not ill-appearing, toxic-appearing or diaphoretic.  HENT:     Head: Normocephalic.     Right Ear: Tympanic membrane, ear canal and external ear normal. There is no impacted cerumen.     Left Ear: Tympanic membrane, ear canal and external ear normal. There is no impacted cerumen.     Nose: Nose normal. No congestion.     Mouth/Throat:     Mouth: Mucous membranes are moist.     Pharynx: No oropharyngeal exudate.     Comments: Thrush present Cardiovascular:     Rate and Rhythm: Regular rhythm. Tachycardia present.     Pulses: Normal pulses.     Heart sounds: Normal heart sounds. No murmur heard.  No friction rub. No gallop.   Pulmonary:     Effort: Pulmonary effort is normal. No respiratory distress.     Breath sounds: Normal breath sounds. No stridor. No wheezing, rhonchi or rales.  Chest:     Chest wall: No tenderness.  Skin:    General: Skin is warm.     Capillary Refill: Capillary refill takes less than 2 seconds.     Findings: Rash present. No erythema.     Comments: Multiple tick bite location with scab  Neurological:     Mental Status: She is alert.      UC Treatments / Results  Labs (all labs ordered are listed, but only abnormal results are displayed) Labs Reviewed  NOVEL CORONAVIRUS, NAA  POCT RAPID STREP A (OFFICE)    EKG   Radiology No results found.  Procedures Procedures (including critical care time)  Medications Ordered in UC Medications - No data to display  Initial Impression / Assessment and  Plan / UC Course  I have reviewed the triage vital signs and the nursing notes.  Pertinent labs & imaging  results that were available during my care of the patient were reviewed by me and considered in my medical decision making (see chart for details).    Patient is stable at discharge.  Doxycycline was prescribed for tick bite as patient may have been showing symptom.  Diflucan was prescribed to prevent yeast infection.  Nystatin was prescribed for oral thrush.  Advised to follow-up with PCP  Final Clinical Impressions(s) / UC Diagnoses   Final diagnoses:  Fever, unspecified  Tick bite, initial encounter  Rash  Encounter for screening for COVID-19  Oral thrush   Discharge Instructions   None    ED Prescriptions    Medication Sig Dispense Auth. Provider   doxycycline (VIBRAMYCIN) 100 MG capsule Take 1 capsule (100 mg total) by mouth 2 (two) times daily for 14 days. 28 capsule Yalitza Teed S, FNP   nystatin (MYCOSTATIN) 100000 UNIT/ML suspension Take 5 mLs (500,000 Units total) by mouth 4 (four) times daily. 60 mL Kysean Sweet S, FNP   fluconazole (DIFLUCAN) 150 MG tablet Take 1 tablet (150 mg total) by mouth daily. Take second dose 72 hours after the first dose if symptom does not resolve 2 tablet Reyann Troop, Darrelyn Hillock, FNP     PDMP not reviewed this encounter.   Emerson Monte, FNP 10/20/19 1226    Emerson Monte, FNP 10/20/19 1227

## 2019-10-20 NOTE — ED Triage Notes (Addendum)
Insect bites to bilateral legs and vagina since Friday.  Pt states she also has some bites in vagina.  States it was little black bugs all over her.  Also reports her head was sore and started itching, she treated herself for lice.  Also reports sore throat since Thursday.

## 2019-10-20 NOTE — Discharge Instructions (Addendum)
Doxycycline was prescribed for tick bite Nystatin for oral thrush Diflucan for possible yeast infection Take medication as prescribed Follow-up with PCP Go to ED for worsening of symptoms

## 2019-10-21 LAB — NOVEL CORONAVIRUS, NAA: SARS-CoV-2, NAA: NOT DETECTED

## 2019-10-21 LAB — SARS-COV-2, NAA 2 DAY TAT

## 2020-01-22 ENCOUNTER — Other Ambulatory Visit: Payer: 59 | Admitting: Adult Health

## 2020-01-22 ENCOUNTER — Ambulatory Visit (INDEPENDENT_AMBULATORY_CARE_PROVIDER_SITE_OTHER): Payer: 59 | Admitting: Adult Health

## 2020-01-22 ENCOUNTER — Other Ambulatory Visit: Payer: Self-pay

## 2020-01-22 ENCOUNTER — Encounter: Payer: Self-pay | Admitting: Adult Health

## 2020-01-22 ENCOUNTER — Other Ambulatory Visit (HOSPITAL_COMMUNITY)
Admission: RE | Admit: 2020-01-22 | Discharge: 2020-01-22 | Disposition: A | Discharge status: Home / Self Care | Payer: 59 | Source: Ambulatory Visit | Attending: Adult Health | Admitting: Adult Health

## 2020-01-22 VITALS — BP 127/84 | HR 80 | Ht 71.0 in | Wt 211.0 lb

## 2020-01-22 DIAGNOSIS — Z01419 Encounter for gynecological examination (general) (routine) without abnormal findings: Secondary | ICD-10-CM

## 2020-01-22 NOTE — Progress Notes (Signed)
Patient ID: Amy Schroeder, female   DOB: April 27, 1981, 39 y.o.   MRN: [id number] History of Present Illness: Amy Schroeder is a 39 year old white female, married, G3P3, in for well woman gyn exam and pap. PCP Halford Decamp NP    Current Medications, Allergies, Past Medical History, Past Surgical History, Family History and Social History were reviewed in [employer] record.     Review of Systems:  Patient denies any headaches, hearing loss, fatigue, blurred vision, shortness of breath, chest pain, abdominal pain, problems with bowel movements(constipated when stressed), urination, or intercourse. No joint pain or mood swings. Periods regular +stress  Physical Exam:BP 127/84 (BP Location: Left Arm, Patient Position: Sitting, Cuff Size: Normal)   Pulse 80   Ht 5\' 11"  (1.803 m)   Wt 211 lb (95.7 kg)   LMP 01/10/2020 (Exact Date)   BMI 29.43 kg/m  General:  Well developed, well nourished, no acute distress Skin:  Warm and dry Neck:  Midline trachea, normal thyroid, good ROM, no lymphadenopathy Lungs; Clear to auscultation bilaterally Breast:  No dominant palpable mass, retraction, or nipple discharge Cardiovascular: Regular rate and rhythm Abdomen:  Soft, non tender, no hepatosplenomegaly Pelvic:  External genitalia is normal in appearance, no lesions.  The vagina is normal in appearance. Urethra has no lesions or masses. The cervix is bulbous.Pap with high risk HPV genotyping performed.   Uterus is felt to be normal size, shape, and contour.  No adnexal masses or tenderness noted.Bladder is non tender, no masses felt. Rectal: Good sphincter tone, no polyps, or hemorrhoids felt.  Hemoccult negative. Extremities/musculoskeletal:  No swelling or varicosities noted, no clubbing or cyanosis Psych:  No mood changes, alert and cooperative,seems happy AA is 0 Fall risk is low PHQ 9 score is 6, no SI  Upstream - 01/22/20 1503      Pregnancy Intention Screening   Does  the patient want to become pregnant in the next year? No    Does the patient's partner want to become pregnant in the next year? No    Would the patient like to discuss contraceptive options today? No      Contraception Wrap Up   Current Method Female Sterilization    End Method Female Sterilization    Contraception Counseling Provided No         Examination chaperoned by 01/24/20 LPN.    Impression and Plan: 1. Encounter for gynecological examination with Papanicolaou smear of cervix Pap sent Physical in 1 year Pap in 3 years if normal Get mammogram now, mon had breast cancer at 54

## 2020-01-27 LAB — CYTOLOGY - PAP
Chlamydia: NEGATIVE
Comment: NEGATIVE
Comment: NEGATIVE
Comment: NORMAL
Diagnosis: NEGATIVE
High risk HPV: NEGATIVE
Neisseria Gonorrhea: NEGATIVE

## 2020-05-20 ENCOUNTER — Encounter: Payer: Self-pay | Admitting: Internal Medicine

## 2020-05-20 ENCOUNTER — Other Ambulatory Visit: Payer: Self-pay

## 2020-05-20 ENCOUNTER — Ambulatory Visit (INDEPENDENT_AMBULATORY_CARE_PROVIDER_SITE_OTHER): Payer: 59 | Admitting: Internal Medicine

## 2020-05-20 VITALS — BP 117/74 | HR 68 | Temp 97.5°F | Ht 71.0 in | Wt 215.0 lb

## 2020-05-20 DIAGNOSIS — F909 Attention-deficit hyperactivity disorder, unspecified type: Secondary | ICD-10-CM | POA: Diagnosis not present

## 2020-05-20 DIAGNOSIS — Z803 Family history of malignant neoplasm of breast: Secondary | ICD-10-CM | POA: Insufficient documentation

## 2020-05-20 DIAGNOSIS — F419 Anxiety disorder, unspecified: Secondary | ICD-10-CM

## 2020-05-20 DIAGNOSIS — F1121 Opioid dependence, in remission: Secondary | ICD-10-CM

## 2020-05-20 DIAGNOSIS — Z7689 Persons encountering health services in other specified circumstances: Secondary | ICD-10-CM

## 2020-05-20 NOTE — Assessment & Plan Note (Signed)
Diagnosed at age 40 in her mother She has a h/o benign cyst Needs to get Mammography
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
## 2020-05-20 NOTE — Patient Instructions (Signed)
Please continue to take medications as prescribed.  Please continue to follow up with your Psychiatrist and Addiction Medicine specialist as scheduled.  Please continue to follow heart healthy diet and perform moderate exercise/walking at least 150 mins/week.  Please bring the blood test reports from Specialist office.

## 2020-05-20 NOTE — Assessment & Plan Note (Signed)
Was on chronic opioid therapy for back pain Currently on Subutex, follows up with Addiction Medicine specialist

## 2020-05-20 NOTE — Assessment & Plan Note (Signed)
Care established Previous chart reviewed History and medications reviewed with the patient 
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
## 2020-05-20 NOTE — Assessment & Plan Note (Signed)
On Adderall Follows up with Psychiatrist

## 2020-11-17 ENCOUNTER — Ambulatory Visit: Payer: 59 | Admitting: Internal Medicine

## 2020-12-15 ENCOUNTER — Ambulatory Visit: Payer: 59 | Admitting: Internal Medicine

## 2021-01-12 ENCOUNTER — Ambulatory Visit (INDEPENDENT_AMBULATORY_CARE_PROVIDER_SITE_OTHER): Payer: 59 | Admitting: Internal Medicine

## 2021-01-12 ENCOUNTER — Encounter: Payer: Self-pay | Admitting: Internal Medicine

## 2021-01-12 ENCOUNTER — Other Ambulatory Visit: Payer: Self-pay

## 2021-01-12 ENCOUNTER — Other Ambulatory Visit: Payer: Self-pay | Admitting: *Deleted

## 2021-01-12 VITALS — BP 121/82 | HR 82 | Resp 18 | Ht 71.0 in | Wt 234.1 lb

## 2021-01-12 DIAGNOSIS — M5416 Radiculopathy, lumbar region: Secondary | ICD-10-CM | POA: Diagnosis not present

## 2021-01-12 DIAGNOSIS — T782XXS Anaphylactic shock, unspecified, sequela: Secondary | ICD-10-CM

## 2021-01-12 DIAGNOSIS — G609 Hereditary and idiopathic neuropathy, unspecified: Secondary | ICD-10-CM

## 2021-01-12 DIAGNOSIS — F909 Attention-deficit hyperactivity disorder, unspecified type: Secondary | ICD-10-CM

## 2021-01-12 DIAGNOSIS — Z23 Encounter for immunization: Secondary | ICD-10-CM | POA: Diagnosis not present

## 2021-01-12 DIAGNOSIS — Z0001 Encounter for general adult medical examination with abnormal findings: Secondary | ICD-10-CM | POA: Diagnosis not present

## 2021-01-12 DIAGNOSIS — Z1159 Encounter for screening for other viral diseases: Secondary | ICD-10-CM

## 2021-01-12 DIAGNOSIS — F1121 Opioid dependence, in remission: Secondary | ICD-10-CM

## 2021-01-12 DIAGNOSIS — Z114 Encounter for screening for human immunodeficiency virus [HIV]: Secondary | ICD-10-CM

## 2021-01-12 DIAGNOSIS — Z0000 Encounter for general adult medical examination without abnormal findings: Secondary | ICD-10-CM

## 2021-01-12 DIAGNOSIS — T782XXA Anaphylactic shock, unspecified, initial encounter: Secondary | ICD-10-CM | POA: Insufficient documentation

## 2021-01-12 MED ORDER — EPINEPHRINE 0.3 MG/0.3ML IJ SOAJ: 0.3000 mg | INTRAMUSCULAR | 2 refills | Status: AC | PRN

## 2021-01-12 MED ORDER — UNABLE TO FIND: 0 refills | Status: DC

## 2021-01-12 NOTE — Patient Instructions (Signed)
Please use compound cream for back pain.  You are being referred to Spine surgery for evaluation of chronic low back pain.  You are being scheduled to get MRI of lumbar spine.

## 2021-01-12 NOTE — Assessment & Plan Note (Signed)
History of chronic low back pain Used to follow-up with spine surgery Has been having radicular symptoms and recurrent falls, check MRI of lumbar spine Referred to spine surgery

## 2021-01-12 NOTE — Assessment & Plan Note (Signed)
Had allergic reaction to fire ants in the past, keeps Epipen with her, asked for refill EpiPen prescribed

## 2021-01-12 NOTE — Assessment & Plan Note (Signed)
On Adderall Follows up with Psychiatrist 

## 2021-01-12 NOTE — Progress Notes (Signed)
Established Patient Office Visit  Subjective:  Patient ID: Amy Schroeder, female    DOB: 04-06-1981  Age: 40 y.o. MRN: [id number]  CC:  Chief Complaint  Patient presents with   Follow-up    6 month follow up pt has been having falls and swelling in her feet for the last week, has spells where she cant walk    HPI Amy Schroeder is a 40 year old female with past medical history of anxiety, ADHD, opioid dependence in remission, chronic low back pain and PTSD who presents for follow up of her chronic medical conditions.  She has been having severe low back pain with radiation to her bilateral LE.  Pain is sharp, 6-8/10, worse with bending and walking and is mildly relieved with Subutex. She has been having balance problem and has had at least 2 falls in the last 1 month.  She has bruising over her left LE from her recent fall.  She used to take oxycodone for her chronic low back pain, but is currently on Subutex for treatment of opioid dependence.  She follows up with addiction medicine specialist.  She has chronic, intermittent numbness and tingling of the LE.  Denies any saddle anesthesia, urinary or stool incontinence.  She reports history of severe allergic reaction to fire ant bite in the past and keeps EpiPen with her.  She asked for refill of it.  She follows up with psychiatrist for anxiety and ADHD.  She takes her medications regularly.  She received flu vaccine in the office today.  Past Medical History:  Diagnosis Date   ADHD    Anxiety    Chronic back pain    Chronic neck pain    Chronic pain    Headache     Past Surgical History:  Procedure Laterality Date   TUBAL LIGATION      Family History  Problem Relation Age of Onset   Cancer Father        ureter   Breast cancer Mother 42   Drug abuse Brother     Social History   Socioeconomic History   Marital status: Married    Spouse name: Not on file   Number of children: Not on file   Years of  education: Not on file   Highest education level: Not on file  Occupational History   Not on file  Tobacco Use   Smoking status: Every Day    Packs/day: 0.50    Types: Cigarettes   Smokeless tobacco: Never  Vaping Use   Vaping Use: Never used  Substance and Sexual Activity   Alcohol use: No   Drug use: No   Sexual activity: Yes    Birth control/protection: Surgical    Comment: tubal  Other Topics Concern   Not on file  Social History Narrative   Not on file   Social Determinants of Health   Financial Resource Strain: Low Risk    Difficulty of Paying Living Expenses: Not hard at all  Food Insecurity: No Food Insecurity   Worried About Programme researcher, broadcasting/film/video in the Last Year: Never true   Ran Out of Food in the Last Year: Never true  Transportation Needs: No Transportation Needs   Lack of Transportation (Medical): No   Lack of Transportation (Non-Medical): No  Physical Activity: Inactive   Days of Exercise per Week: 0 days   Minutes of Exercise per Session: 0 min  Stress: No Stress Concern Present   Feeling of Stress : Not at  all  Social Connections: Press photographer of Communication with Friends and Family: Twice a week   Frequency of Social Gatherings with Friends and Family: Once a week   Attends Religious Services: More than 4 times per year   Active Member of [employer] or Organizations: Yes   Attends Engineer, structural: More than 4 times per year   Marital Status: Married  Catering manager Violence: Not At Risk   Fear of Current or Ex-Partner: No   Emotionally Abused: No   Physically Abused: No   Sexually Abused: No    Outpatient Medications Prior to Visit  Medication Sig Dispense Refill   acetaminophen (TYLENOL) 500 MG tablet Take 500-650 mg by mouth daily as needed for mild pain or moderate pain.     ALPRAZolam (XANAX) 1 MG tablet Take 1 mg by mouth 4 (four) times daily. May take as needed     amphetamine-dextroamphetamine (ADDERALL) 30  MG tablet Take 30 mg by mouth 2 (two) times daily.     buprenorphine (SUBUTEX) 8 MG SUBL SL tablet Place 8 mg under the tongue 3 (three) times daily.     ibuprofen (ADVIL,MOTRIN) 200 MG tablet Take 400 mg by mouth daily as needed for mild pain or moderate pain.      Multiple Vitamin (MULTIVITAMIN WITH MINERALS) TABS tablet Take 1 tablet by mouth daily.     vitamin B-12 (CYANOCOBALAMIN) 500 MCG tablet Take 500 mcg by mouth daily.     No facility-administered medications prior to visit.    Allergies  Allergen Reactions   Neurontin [Gabapentin] Nausea Only   Ultram [Tramadol Hcl] Other (See Comments)    seizures    ROS Review of Systems  Constitutional:  Negative for chills and fever.  HENT:  Negative for congestion, sinus pressure, sinus pain and sore throat.   Eyes:  Negative for pain and discharge.  Respiratory:  Negative for cough and shortness of breath.   Cardiovascular:  Negative for chest pain and palpitations.  Gastrointestinal:  Negative for abdominal pain, constipation, diarrhea, nausea and vomiting.  Endocrine: Negative for polydipsia and polyuria.  Genitourinary:  Negative for dysuria and hematuria.  Musculoskeletal:  Positive for back pain. Negative for neck pain and neck stiffness.  Skin:  Negative for rash.  Neurological:  Positive for numbness. Negative for dizziness and weakness.  Psychiatric/Behavioral:  Negative for agitation and behavioral problems. The patient is nervous/anxious.      Objective:    Physical Exam Vitals reviewed.  Constitutional:      General: She is not in acute distress.    Appearance: She is not diaphoretic.  HENT:     Head: Normocephalic and atraumatic.     Nose: Nose normal.     Mouth/Throat:     Mouth: Mucous membranes are moist.  Eyes:     General: No scleral icterus.    Extraocular Movements: Extraocular movements intact.  Cardiovascular:     Rate and Rhythm: Normal rate and regular rhythm.     Pulses: Normal pulses.      Heart sounds: Normal heart sounds. No murmur heard. Pulmonary:     Breath sounds: Normal breath sounds. No wheezing or rales.  Abdominal:     Palpations: Abdomen is soft.     Tenderness: There is no abdominal tenderness.  Musculoskeletal:     Cervical back: Neck supple. No tenderness.     Right lower leg: No edema.     Left lower leg: No edema.  Skin:  General: Skin is warm.     Findings: No rash.  Neurological:     General: No focal deficit present.     Mental Status: She is alert and oriented to person, place, and time.     Sensory: No sensory deficit.     Motor: No weakness.  Psychiatric:        Mood and Affect: Mood normal.        Behavior: Behavior normal.    BP 121/82 (BP Location: Left Arm, Patient Position: Sitting, Cuff Size: Normal)   Pulse 82   Resp 18   Ht 5\' 11"  (1.803 m)   Wt 234 lb 1.3 oz (106.2 kg)   SpO2 97%   BMI 32.65 kg/m  Wt Readings from Last 3 Encounters:  01/12/21 234 lb 1.3 oz (106.2 kg)  05/20/20 215 lb (97.5 kg)  01/22/20 211 lb (95.7 kg)     Health Maintenance Due  Topic Date Due   Pneumococcal Vaccine 950-40 Years old (1 - PCV) Never done   Hepatitis C Screening  Never done    There are no preventive care reminders to display for this patient.  No results found for: TSH Lab Results  Component Value Date   WBC 6.7 01/18/2019   HGB 12.7 01/18/2019   HCT 39.4 01/18/2019   MCV 94.0 01/18/2019   PLT 276 01/18/2019   Lab Results  Component Value Date   NA 138 01/18/2019   K 4.1 01/18/2019   CO2 24 01/18/2019   GLUCOSE 94 01/18/2019   BUN 6 01/18/2019   CREATININE 0.68 01/18/2019   BILITOT 0.5 01/18/2019   ALKPHOS 47 01/18/2019   AST 14 (L) 01/18/2019   ALT 16 01/18/2019   PROT 6.4 (L) 01/18/2019   ALBUMIN 3.6 01/18/2019   CALCIUM 8.9 01/18/2019   ANIONGAP 9 01/18/2019   No results found for: CHOL No results found for: HDL No results found for: LDLCALC No results found for: TRIG No results found for: CHOLHDL No results  found for: HGBA1C    Assessment & Plan:   Problem List Items Addressed This Visit       Nervous and Auditory   Lumbar radiculopathy - Primary    History of chronic low back pain Used to follow-up with spine surgery Has been having radicular symptoms and recurrent falls, check MRI of lumbar spine Referred to spine surgery      Relevant Medications   UNABLE TO FIND   Other Relevant Orders   MR Lumbar Spine Wo Contrast   Ambulatory referral to Spine Surgery   Idiopathic peripheral neuropathy    Prescribed compound cream containing gabapentin, lidocaine and cyclobenzaprine Patient has tried this before and has benefited from it.        Other   Attention deficit hyperactivity disorder (ADHD)    On Adderall Follows up with Psychiatrist      Opioid dependence in remission (HCC)    Was on chronic opioid therapy for back pain Currently on Subutex, follows up with Addiction Medicine specialist      Anaphylactic syndrome    Had allergic reaction to fire ants in the past, keeps Epipen with her, asked for refill EpiPen prescribed      Relevant Medications   EPINEPHrine 0.3 mg/0.3 mL IJ SOAJ injection   Other Visit Diagnoses     Need for immunization against influenza       Relevant Orders   Flu Vaccine QUAD 7572mo+IM (Fluarix, Fluzone & Alfiuria Quad PF) (Completed)  Need for hepatitis C screening test       Relevant Orders   Hepatitis C Antibody   Encounter for screening for HIV       Relevant Orders   HIV antibody (with reflex)       Meds ordered this encounter  Medications   EPINEPHrine 0.3 mg/0.3 mL IJ SOAJ injection    Sig: Inject 0.3 mg into the muscle as needed for anaphylaxis.    Dispense:  2 each    Refill:  2   UNABLE TO FIND    Sig: Med Name: Gabapentin 6%/Lidocaine 5%/Cyclobenzaprine 2%. Apply topically for back pain relief. Day supply: 30    Dispense:  60 g    Refill:  0    Follow-up: Return in about 4 months (around 05/14/2021) for Annual  physical.    Anabel Halon, MD

## 2021-01-12 NOTE — Assessment & Plan Note (Signed)
Prescribed compound cream containing gabapentin, lidocaine and cyclobenzaprine Patient has tried this before and has benefited from it.

## 2021-01-12 NOTE — Assessment & Plan Note (Signed)
Was on chronic opioid therapy for back pain Currently on Subutex, follows up with Addiction Medicine specialist 

## 2021-01-20 ENCOUNTER — Other Ambulatory Visit (HOSPITAL_COMMUNITY): Payer: Self-pay | Admitting: Neurosurgery

## 2021-01-20 DIAGNOSIS — M5416 Radiculopathy, lumbar region: Secondary | ICD-10-CM

## 2021-01-20 DIAGNOSIS — M5412 Radiculopathy, cervical region: Secondary | ICD-10-CM

## 2021-02-01 ENCOUNTER — Encounter (HOSPITAL_COMMUNITY): Payer: Self-pay

## 2021-02-01 ENCOUNTER — Ambulatory Visit (HOSPITAL_COMMUNITY): Admission: RE | Admit: 2021-02-01 | Payer: 59 | Source: Ambulatory Visit

## 2021-02-01 ENCOUNTER — Ambulatory Visit (HOSPITAL_COMMUNITY): Payer: 59 | Attending: Neurosurgery

## 2021-02-18 ENCOUNTER — Ambulatory Visit (HOSPITAL_COMMUNITY): Payer: 59

## 2021-02-18 ENCOUNTER — Ambulatory Visit (HOSPITAL_COMMUNITY): Admission: RE | Admit: 2021-02-18 | Payer: 59 | Source: Ambulatory Visit

## 2021-03-02 ENCOUNTER — Ambulatory Visit (HOSPITAL_COMMUNITY)
Admission: RE | Admit: 2021-03-02 | Discharge: 2021-03-02 | Disposition: A | Discharge status: Home / Self Care | Payer: 59 | Source: Ambulatory Visit | Attending: Neurosurgery | Admitting: Neurosurgery

## 2021-03-02 ENCOUNTER — Other Ambulatory Visit: Payer: Self-pay

## 2021-03-02 DIAGNOSIS — M5412 Radiculopathy, cervical region: Secondary | ICD-10-CM | POA: Diagnosis not present

## 2021-03-02 DIAGNOSIS — M5416 Radiculopathy, lumbar region: Secondary | ICD-10-CM | POA: Diagnosis present

## 2021-03-02 IMAGING — MR MR CERVICAL SPINE W/O CM
5 series · 34 of 48 positions shown · non-contrast
Comparison: Radiograph from [DATE] and CT from [DATE].

CLINICAL DATA: Initial evaluation for chronic pain in upper and
lower back, numbness in both arms.

EXAM:
MRI CERVICAL SPINE WITHOUT CONTRAST
TECHNIQUE: Multiplanar, multisequence MR imaging of the cervical spine was
performed. No intravenous contrast was administered.

[Series 9: t2_tse_sag_fast · sagittal · 3.0mm · 0.43mm/px · 6 of 13 slices shown]
[im 1/13]
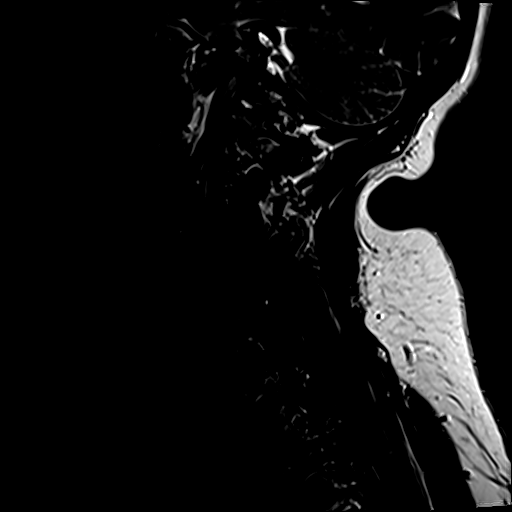
[im 3/13]
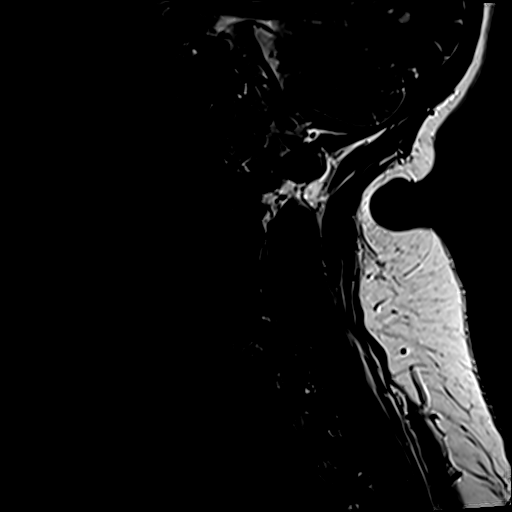
[im 5/13]
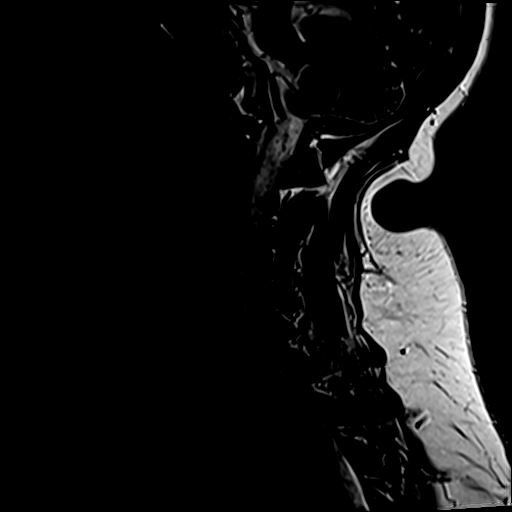
[im 8/13]
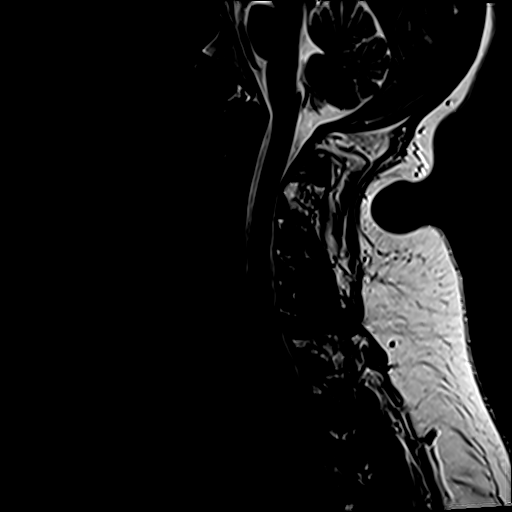
[im 10/13]
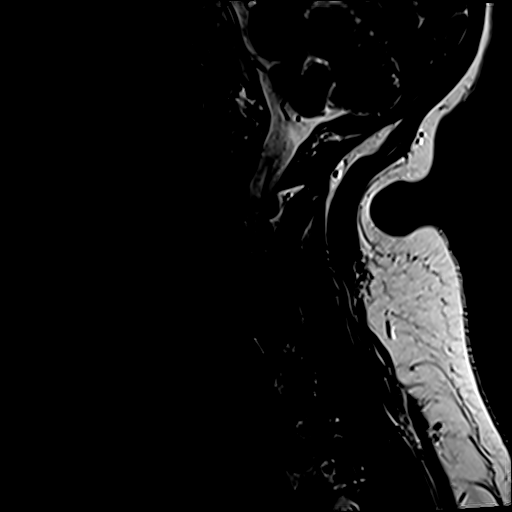
[im 13/13]
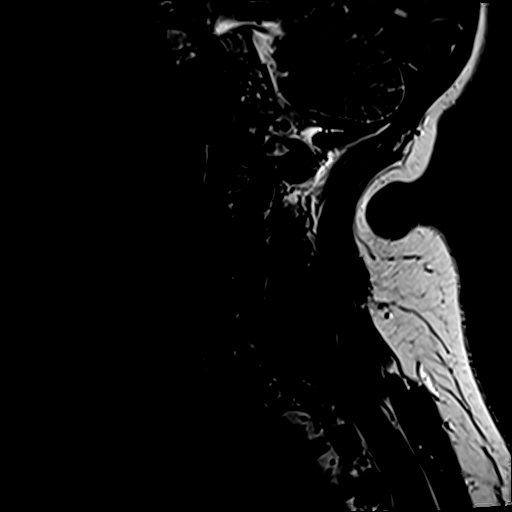

[Series 10: t1_tse_sag_fast · sagittal · 3.0mm · 0.43mm/px · 6 of 13 slices shown]
[im 1/13]
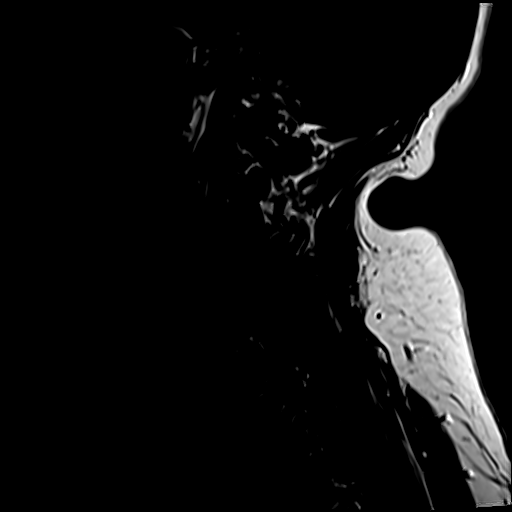
[im 3/13]
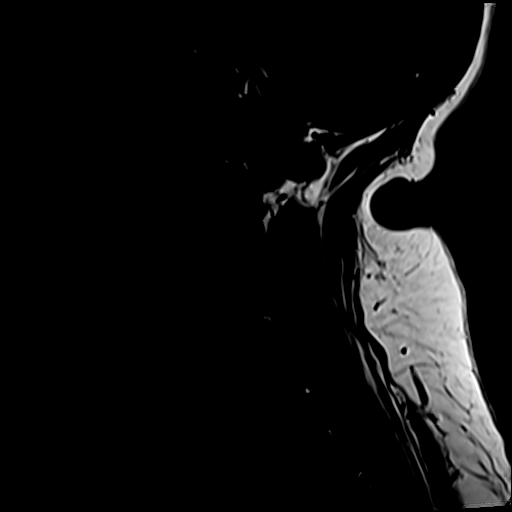
[im 5/13]
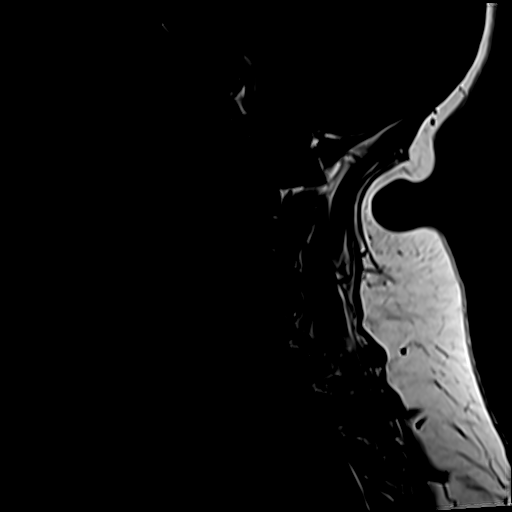
[im 8/13]
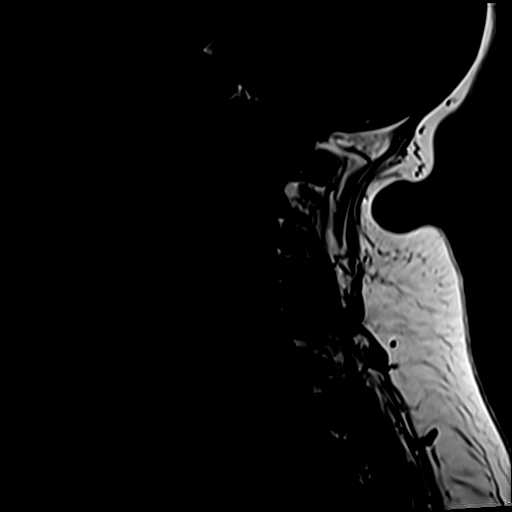
[im 10/13]
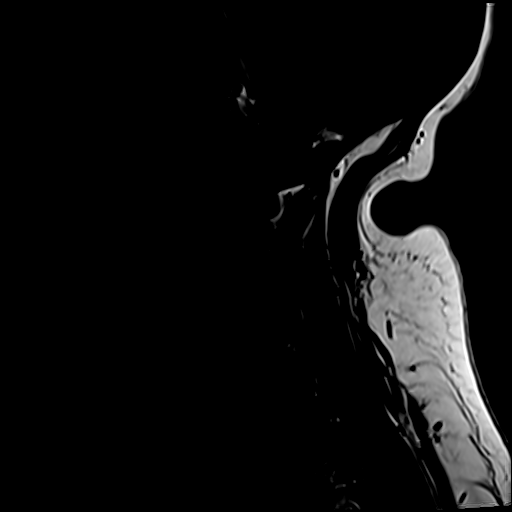
[im 13/13]
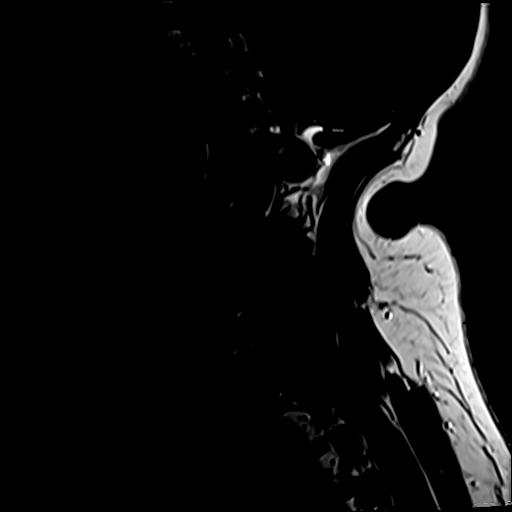

[Series 11: STIR · sagittal · 3.0mm · 0.86mm/px · 6 of 13 slices shown]
[im 1/13]
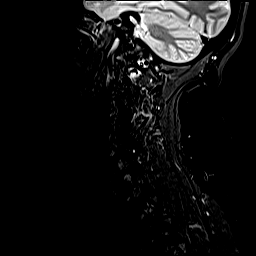
[im 3/13]
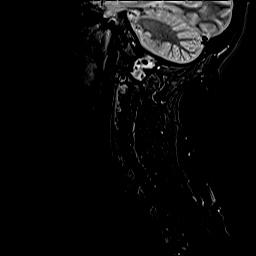
[im 5/13]
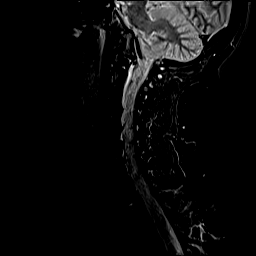
[im 8/13]
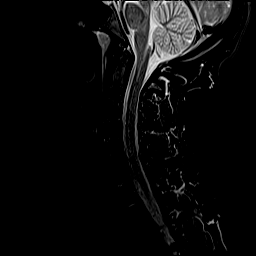
[im 10/13]
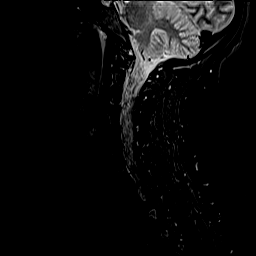
[im 13/13]
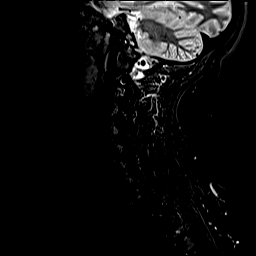

[Series 12: t2_tse_tra_fast · axial · 3.0mm · 0.78mm/px · z∈[-45,+49]mm · 9 of 30 slices shown]
[im 1/30]
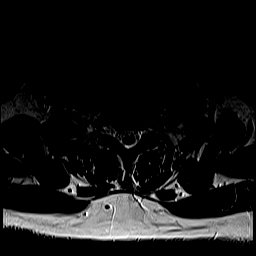
[im 5/30]
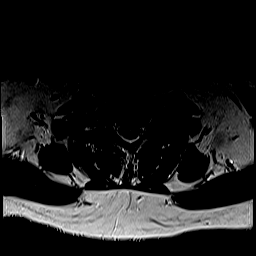
[im 9/30]
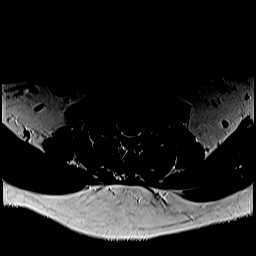
[im 13/30]
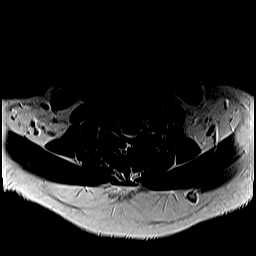
[im 15/30]
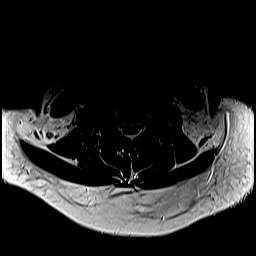
[im 17/30]
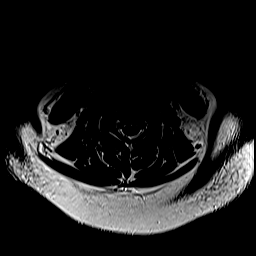
[im 21/30]
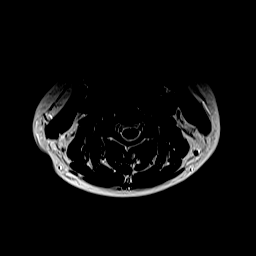
[im 25/30]
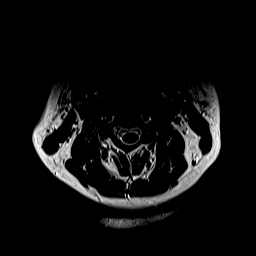
[im 30/30]
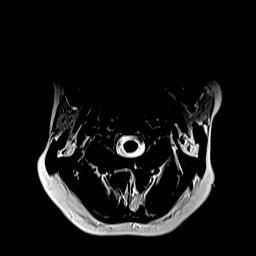

[Series 13: GRE · axial · 3.0mm · 0.78mm/px · z∈[-45,+33]mm · 7 of 30 slices shown]
[im 1/30]
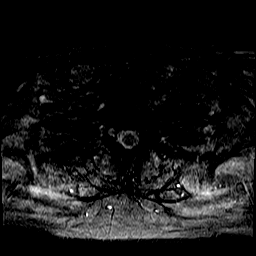
[im 5/30]
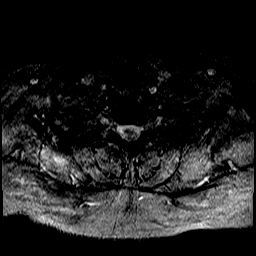
[im 9/30]
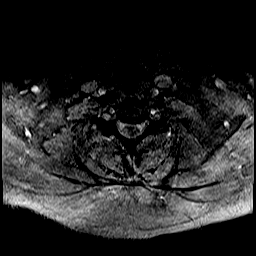
[im 13/30]
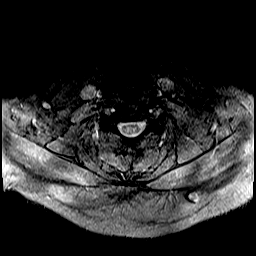
[im 17/30]
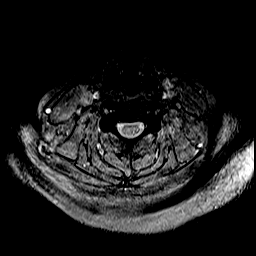
[im 21/30]
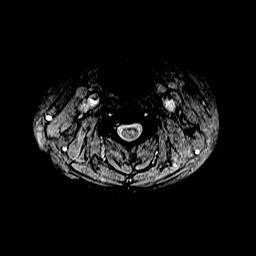
[im 25/30]
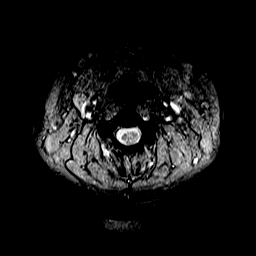

[34 of 48 positions shown; findings below may reference images not displayed]

FINDINGS: Alignment: Vertebral bodies normally aligned with preservation of
the normal cervical lordosis. Underlying mild levoscoliosis. No
listhesis.

Vertebrae: Vertebral a height maintained without acute or chronic
fracture. Bone marrow signal intensity within normal limits. No
discrete or worrisome osseous lesions. No abnormal marrow edema.

Cord: Normal signal and morphology.

Posterior Fossa, vertebral arteries, paraspinal tissues: Visualized
brain and posterior fossa within normal limits. Craniocervical
junction normal. Paraspinous and prevertebral soft tissues within
normal limits. Normal intravascular flow voids seen within the
vertebral arteries bilaterally.

Disc levels:

C2-C3: Unremarkable.

C3-C4:  Unremarkable.

C4-C5:  Unremarkable.

C5-C6: Mild disc bulge, eccentric to the right. Associated mild
right greater than left uncovertebral spurring. Minimal flattening
of the ventral thecal sac without significant spinal stenosis.
Moderate right C6 foraminal narrowing. Left neural foramen remains
patent.

C6-C7: Mild disc bulge flattens and partially effaces the ventral
thecal sac. No significant spinal stenosis. Foramina remain patent.

C7-T1:  Unremarkable.

Visualized upper thoracic spine demonstrates no significant finding.
IMPRESSION: 1. Right eccentric disc bulge with uncovertebral spurring at C5-6
with resultant moderate right C6 foraminal stenosis.
2. Mild noncompressive disc bulge at C6-7 without stenosis.
3. Underlying mild levoscoliosis.

## 2021-03-02 IMAGING — MR MR LUMBAR SPINE W/O CM
5 series · 33 of 48 positions shown · non-contrast
Comparison: Radiography [DATE]

CLINICAL DATA: Lumbar back pain with right-sided radiculopathy

EXAM:
MRI LUMBAR SPINE WITHOUT CONTRAST
TECHNIQUE: Multiplanar, multisequence MR imaging of the lumbar spine was
performed. No intravenous contrast was administered.

[Series 5: T2 · sagittal · 4.0mm · 0.88mm/px · 6 of 13 slices shown (1 of 2)]
[im 1/13]
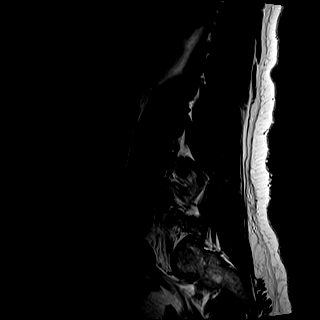
[im 3/13]
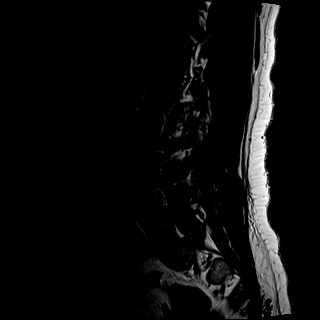
[im 5/13]
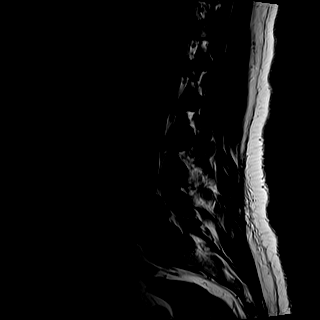
[im 8/13]
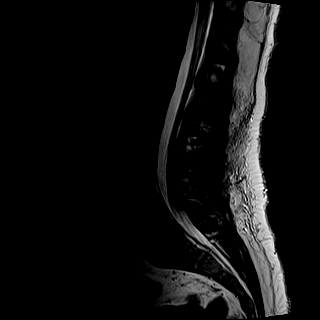
[im 10/13]
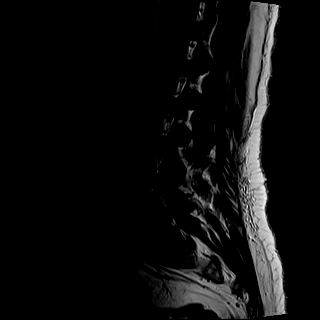
[im 13/13]
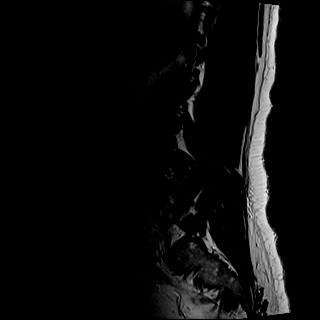

[Series 6: T1 · sagittal · 4.0mm · 1.09mm/px · 6 of 13 slices shown (1 of 2)]
[im 1/13]
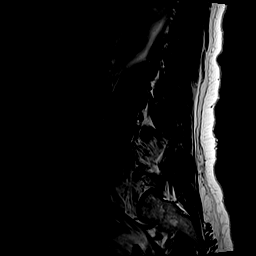
[im 3/13]
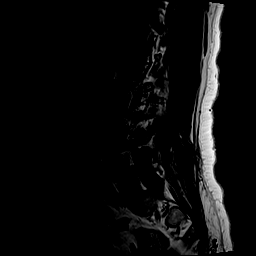
[im 5/13]
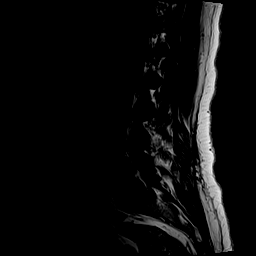
[im 8/13]
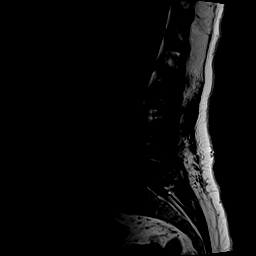
[im 10/13]
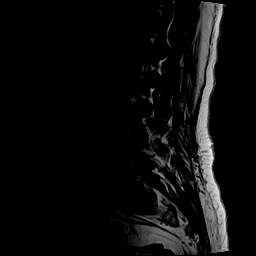
[im 13/13]
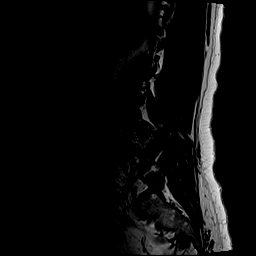

[Series 7: STIR · sagittal · 4.0mm · 0.55mm/px · 3 of 13 slices shown]
[im 1/13]
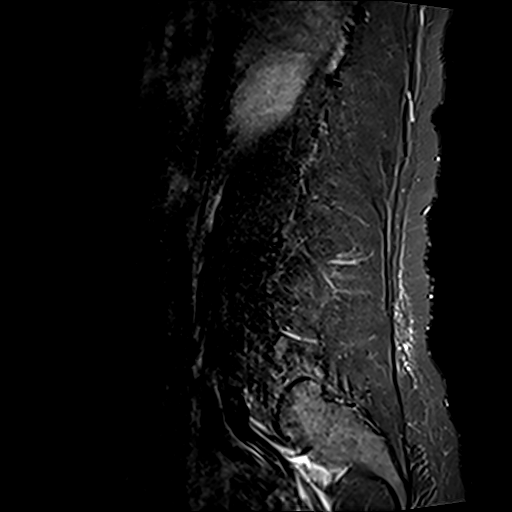
[im 3/13]
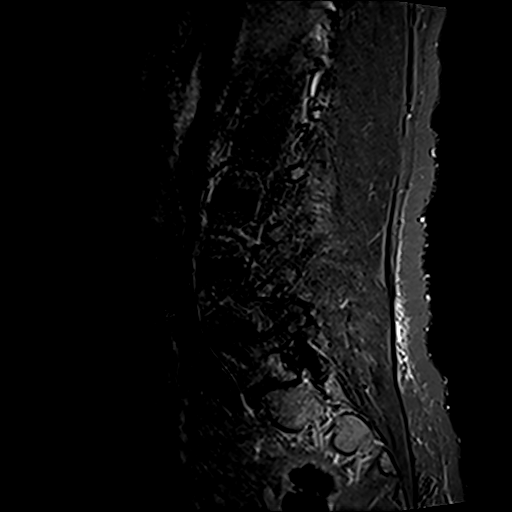
[im 5/13]
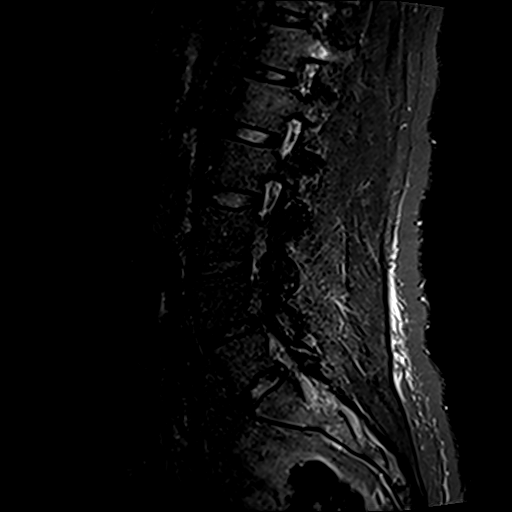

[Series 8: T2 · axial · 4.0mm · 0.70mm/px · z∈[-503,-251]mm · 9 of 30 slices shown (2 of 2)]
[im 1/30]
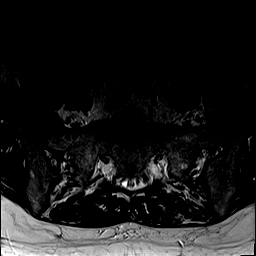
[im 5/30]
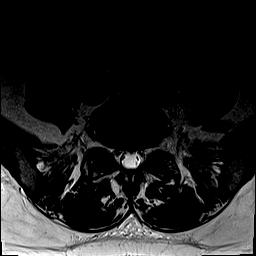
[im 9/30]
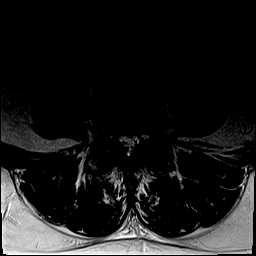
[im 13/30]
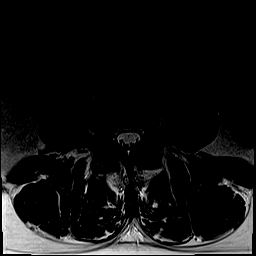
[im 15/30]
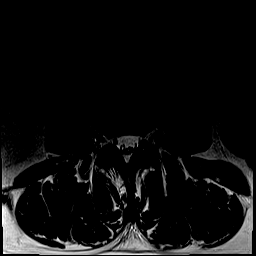
[im 17/30]
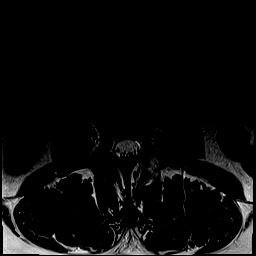
[im 21/30]
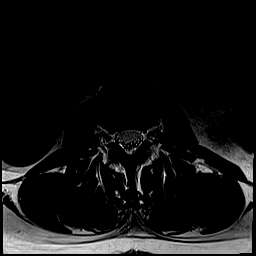
[im 25/30]
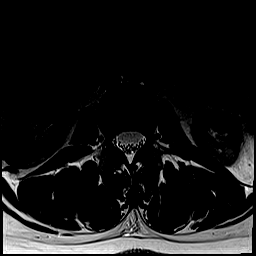
[im 30/30]
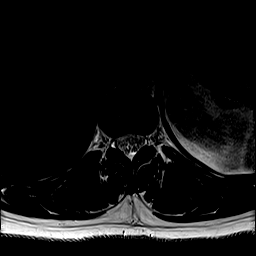

[Series 9: T1 · axial · 4.0mm · 0.35mm/px · z∈[-503,-251]mm · 9 of 30 slices shown (2 of 2)]
[im 1/30]
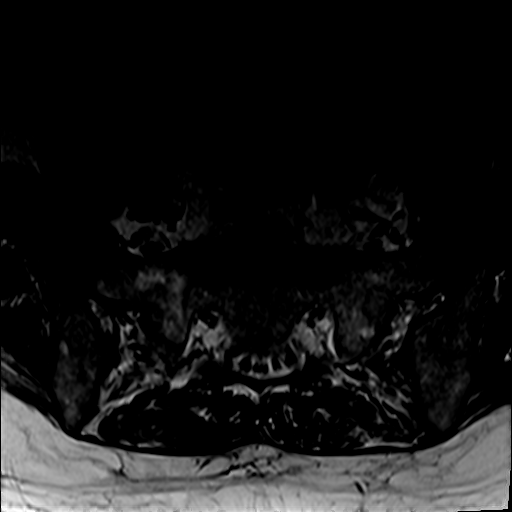
[im 5/30]
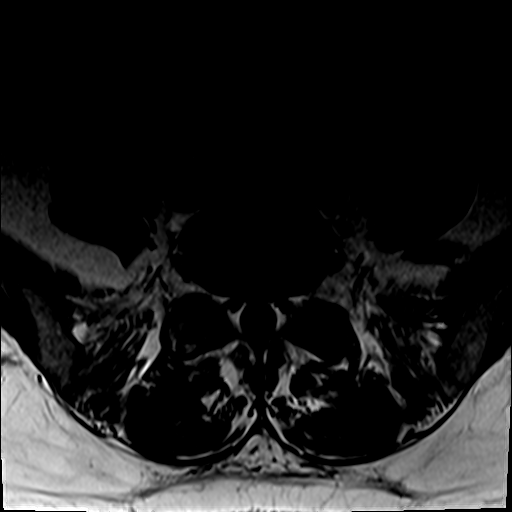
[im 9/30]
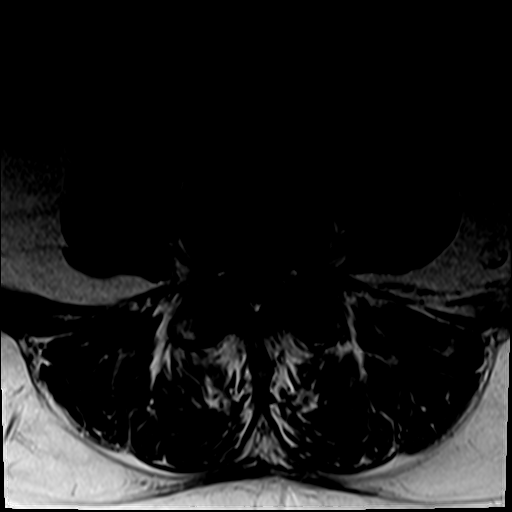
[im 13/30]
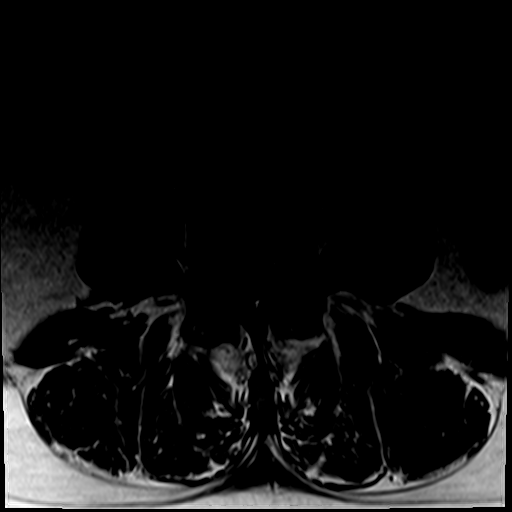
[im 15/30]
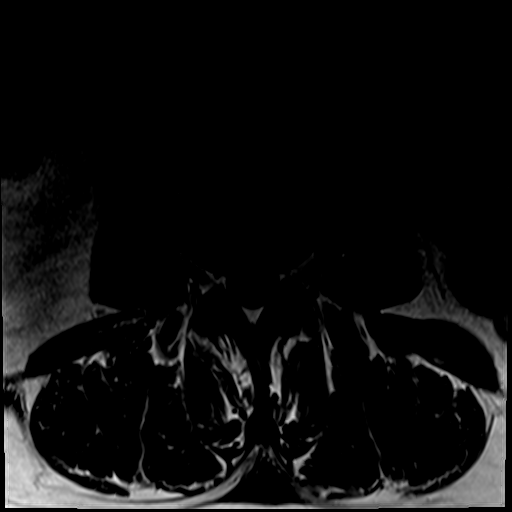
[im 17/30]
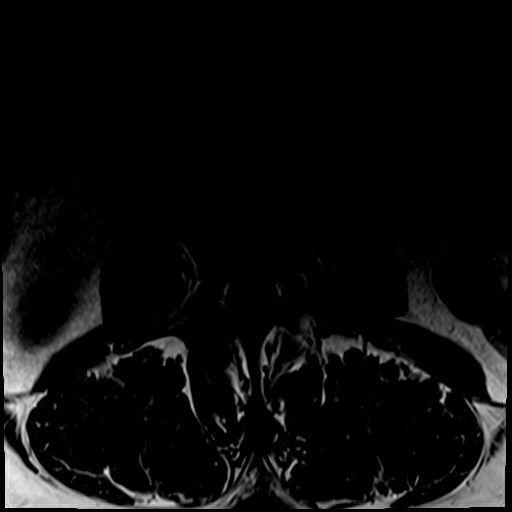
[im 21/30]
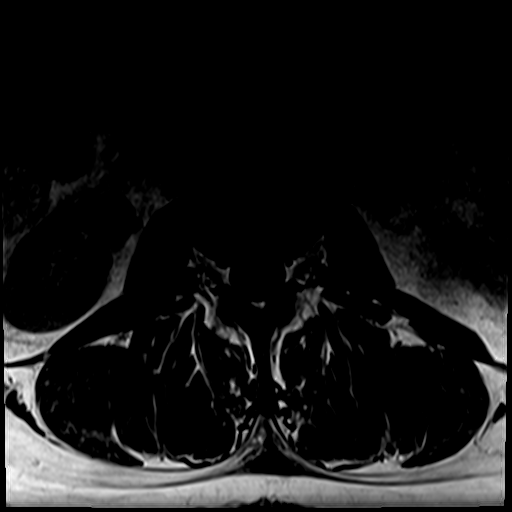
[im 25/30]
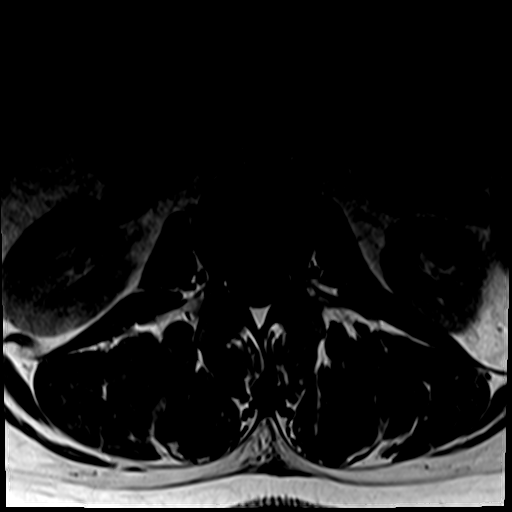
[im 30/30]
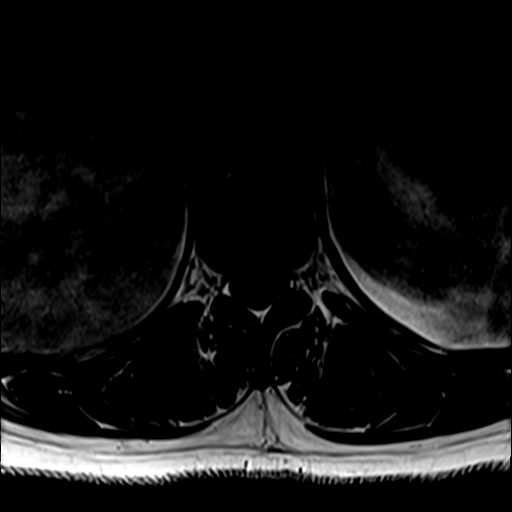

[33 of 48 positions shown; findings below may reference images not displayed]

FINDINGS: Segmentation: 5 lumbar type vertebrae

Alignment:  Degenerative grade 1 anterolisthesis at L4-5 and L5-S1.

Vertebrae:  No fracture, evidence of discitis, or bone lesion.

Conus medullaris and cauda equina: Conus extends to the T12 level.
Conus and cauda equina appear normal.

Paraspinal and other soft tissues: No evidence of perispinal mass or
inflammation.

Disc levels:

L4-L5: Moderate facet spurring and ligamentum flavum thickening.
Mild anterolisthesis. Left eccentric disc bulging. Patent canal and
foramina

L5-S1:Mild facet spurring and anterolisthesis.  Mild disc bulging.
IMPRESSION: 1. Lower lumbar facet osteoarthritis especially at L4-5, with mild
anterolisthesis.
2. No neural compression.

## 2021-05-17 ENCOUNTER — Other Ambulatory Visit: Payer: Self-pay | Admitting: Orthopaedic Surgery

## 2021-05-17 DIAGNOSIS — M25571 Pain in right ankle and joints of right foot: Secondary | ICD-10-CM

## 2021-05-17 DIAGNOSIS — Z114 Encounter for screening for human immunodeficiency virus [HIV]: Secondary | ICD-10-CM | POA: Diagnosis not present

## 2021-05-17 DIAGNOSIS — Z0000 Encounter for general adult medical examination without abnormal findings: Secondary | ICD-10-CM | POA: Diagnosis not present

## 2021-05-17 DIAGNOSIS — Z1159 Encounter for screening for other viral diseases: Secondary | ICD-10-CM | POA: Diagnosis not present

## 2021-05-18 ENCOUNTER — Other Ambulatory Visit: Payer: Self-pay

## 2021-05-18 ENCOUNTER — Encounter: Payer: Self-pay | Admitting: Internal Medicine

## 2021-05-18 ENCOUNTER — Ambulatory Visit (INDEPENDENT_AMBULATORY_CARE_PROVIDER_SITE_OTHER): Payer: BC Managed Care – PPO | Admitting: Internal Medicine

## 2021-05-18 VITALS — BP 118/82 | HR 77 | Resp 18 | Ht 71.0 in | Wt 240.0 lb

## 2021-05-18 DIAGNOSIS — M5416 Radiculopathy, lumbar region: Secondary | ICD-10-CM | POA: Diagnosis not present

## 2021-05-18 DIAGNOSIS — Z0001 Encounter for general adult medical examination with abnormal findings: Secondary | ICD-10-CM | POA: Insufficient documentation

## 2021-05-18 DIAGNOSIS — E782 Mixed hyperlipidemia: Secondary | ICD-10-CM

## 2021-05-18 DIAGNOSIS — G8929 Other chronic pain: Secondary | ICD-10-CM

## 2021-05-18 DIAGNOSIS — E559 Vitamin D deficiency, unspecified: Secondary | ICD-10-CM

## 2021-05-18 DIAGNOSIS — R768 Other specified abnormal immunological findings in serum: Secondary | ICD-10-CM | POA: Diagnosis not present

## 2021-05-18 DIAGNOSIS — M25571 Pain in right ankle and joints of right foot: Secondary | ICD-10-CM

## 2021-05-18 DIAGNOSIS — R7303 Prediabetes: Secondary | ICD-10-CM | POA: Diagnosis not present

## 2021-05-18 DIAGNOSIS — F1121 Opioid dependence, in remission: Secondary | ICD-10-CM

## 2021-05-18 LAB — CMP14+EGFR
ALT: 9 IU/L (ref 0–32)
AST: 12 IU/L (ref 0–40)
Albumin/Globulin Ratio: 1.8 (ref 1.2–2.2)
Albumin: 4.4 g/dL (ref 3.8–4.8)
Alkaline Phosphatase: 52 IU/L (ref 44–121)
BUN/Creatinine Ratio: 8 — ABNORMAL LOW (ref 9–23)
BUN: 9 mg/dL (ref 6–24)
Bilirubin Total: 0.5 mg/dL (ref 0.0–1.2)
CO2: 21 mmol/L (ref 20–29)
Calcium: 9.6 mg/dL (ref 8.7–10.2)
Chloride: 103 mmol/L (ref 96–106)
Creatinine, Ser: 1.17 mg/dL — ABNORMAL HIGH (ref 0.57–1.00)
Globulin, Total: 2.5 g/dL (ref 1.5–4.5)
Glucose: 97 mg/dL (ref 70–99)
Potassium: 4.6 mmol/L (ref 3.5–5.2)
Sodium: 140 mmol/L (ref 134–144)
Total Protein: 6.9 g/dL (ref 6.0–8.5)
eGFR: 60 mL/min/{1.73_m2} (ref >59)

## 2021-05-18 LAB — CBC WITH DIFFERENTIAL/PLATELET
Basophils Absolute: 0 10*3/uL (ref 0.0–0.2)
Basos: 0 %
EOS (ABSOLUTE): 0.1 10*3/uL (ref 0.0–0.4)
Eos: 1 %
Hematocrit: 39.7 % (ref 34.0–46.6)
Hemoglobin: 13.3 g/dL (ref 11.1–15.9)
Immature Grans (Abs): 0 10*3/uL (ref 0.0–0.1)
Immature Granulocytes: 0 %
Lymphocytes Absolute: 1.9 10*3/uL (ref 0.7–3.1)
Lymphs: 34 %
MCH: 30.6 pg (ref 26.6–33.0)
MCHC: 33.5 g/dL (ref 31.5–35.7)
MCV: 91 fL (ref 79–97)
Monocytes Absolute: 0.5 10*3/uL (ref 0.1–0.9)
Monocytes: 8 %
Neutrophils Absolute: 3.1 10*3/uL (ref 1.4–7.0)
Neutrophils: 57 %
Platelets: 275 10*3/uL (ref 150–450)
RBC: 4.35 x10E6/uL (ref 3.77–5.28)
RDW: 12.5 % (ref 11.7–15.4)
WBC: 5.6 10*3/uL (ref 3.4–10.8)

## 2021-05-18 LAB — LIPID PANEL
Chol/HDL Ratio: 4.1 ratio (ref 0.0–4.4)
Cholesterol, Total: 174 mg/dL (ref 100–199)
HDL: 42 mg/dL (ref >39)
LDL Chol Calc (NIH): 111 mg/dL — ABNORMAL HIGH (ref 0–99)
Triglycerides: 117 mg/dL (ref 0–149)
VLDL Cholesterol Cal: 21 mg/dL (ref 5–40)

## 2021-05-18 LAB — TSH: TSH: 2.32 u[IU]/mL (ref 0.450–4.500)

## 2021-05-18 LAB — HEMOGLOBIN A1C
Est. average glucose Bld gHb Est-mCnc: 117 mg/dL
Hgb A1c MFr Bld: 5.7 % — ABNORMAL HIGH (ref 4.8–5.6)

## 2021-05-18 LAB — VITAMIN D 25 HYDROXY (VIT D DEFICIENCY, FRACTURES): Vit D, 25-Hydroxy: 23 ng/mL — ABNORMAL LOW (ref 30.0–100.0)

## 2021-05-18 LAB — HIV ANTIBODY (ROUTINE TESTING W REFLEX): HIV Screen 4th Generation wRfx: NONREACTIVE

## 2021-05-18 LAB — HEPATITIS C ANTIBODY: Hep C Virus Ab: 2.6 s/co ratio — ABNORMAL HIGH (ref 0.0–0.9)

## 2021-05-18 NOTE — Assessment & Plan Note (Signed)
History of chronic low back pain Has been having radicular symptoms and recurrent falls, check MRI of lumbar spine Has seen spine surgery - advised to continue pain management

## 2021-05-18 NOTE — Patient Instructions (Signed)
Please continue to take medications as prescribed.  Please start following low carb and low cholesterol diet and ambulate as tolerated.

## 2021-05-18 NOTE — Assessment & Plan Note (Signed)
Lipid profile reviewed Advised to follow low carb and low cholesterol diet

## 2021-05-18 NOTE — Assessment & Plan Note (Signed)
Last vitamin D Lab Results  Component Value Date   VD25OH 23.0 (L) 05/17/2021   Advised to take Vitamin D 5000 IU QD

## 2021-05-18 NOTE — Progress Notes (Signed)
Established Patient Office Visit  Subjective:  Patient ID: Amy Schroeder, female    DOB: [birth year]  Age: 41 y.o. MRN: [id number]  CC:  Chief Complaint  Patient presents with   Annual Exam    Annual exam pt is having pain in her foot but is seeing [facility] ortho for this lower back has also been hurting this has been going on since 1996 and having swelling in legs and feet since 03-29-21    HPI Amy Schroeder is a 41 y.o. female with past medical history of anxiety, ADHD, opioid dependence in remission, chronic low back pain and PTSD who presents for annual physical.  She has been having severe low back pain with radiation to her bilateral LE.  Pain is sharp, 6-8/10, worse with bending and walking and is mildly relieved with Subutex. She has seen Spine surgeon and has had MRI of lumbar spine and Cervical spine. She was advised to continue pain management. is currently on Subutex for treatment of opioid dependence.  She follows up with addiction medicine specialist.  She has chronic, intermittent numbness and tingling of the LE.  Denies any saddle anesthesia, urinary or stool incontinence.  She c/o right ankle pain and swelling, for which she has seen Orthopedic surgeon and has ASO brace in place.  She follows up with psychiatrist for anxiety and ADHD.  She takes her medications regularly.  Blood tests were reviewed and discussed with the patient in detail.  Past Medical History:  Diagnosis Date   ADHD    Anxiety    Chronic back pain    Chronic neck pain    Chronic pain    Headache     Past Surgical History:  Procedure Laterality Date   TUBAL LIGATION      Family History  Problem Relation Age of Onset   Cancer Father        ureter   Breast cancer Mother 20   Drug abuse Brother     Social History   Socioeconomic History   Marital status: Married    Spouse name: Not on file   Number of children: Not on file   Years of education: Not on file   Highest  education level: Not on file  Occupational History   Not on file  Tobacco Use   Smoking status: Every Day    Packs/day: 0.50    Types: Cigarettes   Smokeless tobacco: Never  Vaping Use   Vaping Use: Never used  Substance and Sexual Activity   Alcohol use: No   Drug use: No   Sexual activity: Yes    Birth control/protection: Surgical    Comment: tubal  Other Topics Concern   Not on file  Social History Narrative   Not on file   Social Determinants of Health   Financial Resource Strain: Not on file  Food Insecurity: Not on file  Transportation Needs: Not on file  Physical Activity: Not on file  Stress: Not on file  Social Connections: Not on file  Intimate Partner Violence: Not on file    Outpatient Medications Prior to Visit  Medication Sig Dispense Refill   acetaminophen (TYLENOL) 500 MG tablet Take 500-650 mg by mouth daily as needed for mild pain or moderate pain.     ALPRAZolam (XANAX) 1 MG tablet Take 1 mg by mouth 4 (four) times daily. May take as needed     amphetamine-dextroamphetamine (ADDERALL) 30 MG tablet Take 30 mg by mouth 2 (two) times daily.  buprenorphine (SUBUTEX) 8 MG SUBL SL tablet Place 8 mg under the tongue 3 (three) times daily.     EPINEPHrine 0.3 mg/0.3 mL IJ SOAJ injection Inject 0.3 mg into the muscle as needed for anaphylaxis. 2 each 2   ibuprofen (ADVIL,MOTRIN) 200 MG tablet Take 400 mg by mouth daily as needed for mild pain or moderate pain.      Multiple Vitamin (MULTIVITAMIN WITH MINERALS) TABS tablet Take 1 tablet by mouth daily.     UNABLE TO FIND Med Name: Gabapentin 6%/Lidocaine 5%/Cyclobenzaprine 2%. Apply topically for back pain relief. Day supply: 30 60 g 0   vitamin B-12 (CYANOCOBALAMIN) 500 MCG tablet Take 500 mcg by mouth daily.     No facility-administered medications prior to visit.    Allergies  Allergen Reactions   Neurontin [Gabapentin] Nausea Only   Ultram [Tramadol Hcl] Other (See Comments)    seizures     ROS Review of Systems  Constitutional:  Negative for chills and fever.  HENT:  Negative for congestion, sinus pressure, sinus pain and sore throat.   Eyes:  Negative for pain and discharge.  Respiratory:  Negative for cough and shortness of breath.   Cardiovascular:  Positive for leg swelling. Negative for chest pain and palpitations.  Gastrointestinal:  Negative for abdominal pain, constipation, diarrhea, nausea and vomiting.  Endocrine: Negative for polydipsia and polyuria.  Genitourinary:  Negative for dysuria and hematuria.  Musculoskeletal:  Positive for arthralgias and back pain. Negative for neck pain and neck stiffness.  Skin:  Negative for rash.  Neurological:  Positive for numbness. Negative for dizziness and weakness.  Psychiatric/Behavioral:  Negative for agitation and behavioral problems. The patient is nervous/anxious.      Objective:    Physical Exam Vitals reviewed.  Constitutional:      General: She is not in acute distress.    Appearance: She is not diaphoretic.  HENT:     Head: Normocephalic and atraumatic.     Nose: Nose normal.     Mouth/Throat:     Mouth: Mucous membranes are moist.  Eyes:     General: No scleral icterus.    Extraocular Movements: Extraocular movements intact.  Cardiovascular:     Rate and Rhythm: Normal rate and regular rhythm.     Pulses: Normal pulses.     Heart sounds: Normal heart sounds. No murmur heard. Pulmonary:     Breath sounds: Normal breath sounds. No wheezing or rales.  Abdominal:     Palpations: Abdomen is soft.     Tenderness: There is no abdominal tenderness.  Musculoskeletal:        General: Tenderness (Lumbar spine area) present.     Cervical back: Neck supple. No tenderness.     Right lower leg: No edema.     Left lower leg: No edema.     Comments: ASO brace in right ankle  Skin:    General: Skin is warm.     Findings: No rash.  Neurological:     General: No focal deficit present.     Mental Status:  She is alert and oriented to person, place, and time.     Cranial Nerves: No cranial nerve deficit.     Sensory: No sensory deficit.     Motor: No weakness.  Psychiatric:        Mood and Affect: Mood normal.        Behavior: Behavior normal.    BP 118/82 (BP Location: Right Arm, Patient Position: Sitting, Cuff Size:  Normal)    Pulse 77    Resp 18    Ht '5\' 11"'  (1.803 m)    Wt 240 lb 0.6 oz (108.9 kg)    SpO2 96%    BMI 33.48 kg/m  Wt Readings from Last 3 Encounters:  05/18/21 240 lb 0.6 oz (108.9 kg)  01/12/21 234 lb 1.3 oz (106.2 kg)  05/20/20 215 lb (97.5 kg)    Lab Results  Component Value Date   TSH 2.320 05/17/2021   Lab Results  Component Value Date   WBC 5.6 05/17/2021   HGB 13.3 05/17/2021   HCT 39.7 05/17/2021   MCV 91 05/17/2021   PLT 275 05/17/2021   Lab Results  Component Value Date   NA 140 05/17/2021   K 4.6 05/17/2021   CO2 21 05/17/2021   GLUCOSE 97 05/17/2021   BUN 9 05/17/2021   CREATININE 1.17 (H) 05/17/2021   BILITOT 0.5 05/17/2021   ALKPHOS 52 05/17/2021   AST 12 05/17/2021   ALT 9 05/17/2021   PROT 6.9 05/17/2021   ALBUMIN 4.4 05/17/2021   CALCIUM 9.6 05/17/2021   ANIONGAP 9 01/18/2019   EGFR 60 05/17/2021   Lab Results  Component Value Date   CHOL 174 05/17/2021   Lab Results  Component Value Date   HDL 42 05/17/2021   Lab Results  Component Value Date   LDLCALC 111 (H) 05/17/2021   Lab Results  Component Value Date   TRIG 117 05/17/2021   Lab Results  Component Value Date   CHOLHDL 4.1 05/17/2021   Lab Results  Component Value Date   HGBA1C 5.7 (H) 05/17/2021      Assessment & Plan:   Encounter for general adult medical examination with abnormal findings Physical exam as documented. Counseling done  re healthy lifestyle involving commitment to 150 minutes exercise per week, heart healthy diet, and attaining healthy weight.The importance of adequate sleep also discussed. Changes in health habits are decided on by the  patient with goals and time frames  set for achieving them. Immunization and cancer screening needs are specifically addressed at this visit.  Prediabetes Lab Results  Component Value Date   HGBA1C 5.7 (H) 05/17/2021   Advised to follow low carb diet  Mixed hyperlipidemia Lipid profile reviewed Advised to follow low carb and low cholesterol diet  Vitamin D deficiency Last vitamin D Lab Results  Component Value Date   VD25OH 23.0 (L) 05/17/2021   Advised to take Vitamin D 5000 IU QD  Positive hepatitis C antibody test She has had it in the past as well, but viral RNA was negative Will check Hep C RNA titer  Opioid dependence in remission ([place]) Was on chronic opioid therapy for back pain Currently on Subutex, follows up with Addiction Medicine specialist - going to find a new pain clinic as her provider is leaving  Lumbar radiculopathy History of chronic low back pain Has been having radicular symptoms and recurrent falls, check MRI of lumbar spine Has seen spine surgery - advised to continue pain management  Chronic pain of right ankle Has seen Dr Lucia Gaskins at [facility] to get MRI of right ankle Has ASO brace in place currently    No orders of the defined types were placed in this encounter.   Follow-up: Return in about 6 months (around 11/15/2021).    Lindell Spar, MD

## 2021-05-18 NOTE — Assessment & Plan Note (Signed)

## 2021-05-18 NOTE — Assessment & Plan Note (Signed)
She has had it in the past as well, but viral RNA was negative Will check Hep C RNA titer

## 2021-05-18 NOTE — Assessment & Plan Note (Signed)
Lab Results  Component Value Date   HGBA1C 5.7 (H) 05/17/2021   Advised to follow low carb diet

## 2021-05-18 NOTE — Assessment & Plan Note (Signed)
Has seen Dr Susa Simmonds at [facility] to get MRI of right ankle Has ASO brace in place currently

## 2021-05-18 NOTE — Assessment & Plan Note (Signed)
Was on chronic opioid therapy for back pain Currently on Subutex, follows up with Addiction Medicine specialist - going to find a new pain clinic as her provider is leaving

## 2021-05-22 LAB — SPECIMEN STATUS REPORT

## 2021-05-22 LAB — HCV RNA DIAGNOSIS, NAA: HCV RNA, Quantitation: NOT DETECTED IU/mL

## 2021-05-27 DIAGNOSIS — F4310 Post-traumatic stress disorder, unspecified: Secondary | ICD-10-CM | POA: Diagnosis not present

## 2021-05-27 DIAGNOSIS — F410 Panic disorder [episodic paroxysmal anxiety] without agoraphobia: Secondary | ICD-10-CM | POA: Diagnosis not present

## 2021-05-27 DIAGNOSIS — F341 Dysthymic disorder: Secondary | ICD-10-CM | POA: Diagnosis not present

## 2021-05-27 DIAGNOSIS — F900 Attention-deficit hyperactivity disorder, predominantly inattentive type: Secondary | ICD-10-CM | POA: Diagnosis not present

## 2021-06-10 ENCOUNTER — Other Ambulatory Visit: Payer: BC Managed Care – PPO

## 2021-06-18 ENCOUNTER — Other Ambulatory Visit: Payer: BC Managed Care – PPO

## 2021-08-18 ENCOUNTER — Ambulatory Visit (INDEPENDENT_AMBULATORY_CARE_PROVIDER_SITE_OTHER): Payer: Medicare Other | Admitting: Internal Medicine

## 2021-08-18 ENCOUNTER — Encounter: Payer: Self-pay | Admitting: Internal Medicine

## 2021-08-18 VITALS — BP 110/75 | HR 59 | Ht 71.0 in | Wt 257.4 lb

## 2021-08-18 DIAGNOSIS — F331 Major depressive disorder, recurrent, moderate: Secondary | ICD-10-CM | POA: Diagnosis not present

## 2021-08-18 DIAGNOSIS — F909 Attention-deficit hyperactivity disorder, unspecified type: Secondary | ICD-10-CM | POA: Diagnosis not present

## 2021-08-18 DIAGNOSIS — M5416 Radiculopathy, lumbar region: Secondary | ICD-10-CM | POA: Diagnosis not present

## 2021-08-18 DIAGNOSIS — F419 Anxiety disorder, unspecified: Secondary | ICD-10-CM

## 2021-08-18 MED ORDER — PREDNISONE 10 MG (21) PO TBPK: ORAL_TABLET | ORAL | 0 refills | Status: DC

## 2021-08-18 MED ORDER — KETOROLAC TROMETHAMINE 60 MG/2ML IM SOLN
60.0000 mg | Freq: Once | INTRAMUSCULAR | Status: AC
  Administered 2021-08-18: 60 mg via INTRAMUSCULAR

## 2021-08-18 MED ORDER — METHYLPREDNISOLONE ACETATE 80 MG/ML IJ SUSP
80.0000 mg | Freq: Once | INTRAMUSCULAR | Status: AC
  Administered 2021-08-18: 80 mg via INTRAMUSCULAR

## 2021-08-18 NOTE — Assessment & Plan Note (Signed)
Followed by psychiatry ?She is currently on an antidepressant, but is unable to provide any detail ?Advised to contact psychiatry ?
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
## 2021-08-18 NOTE — Progress Notes (Signed)
? ?Established Patient Office Visit ? ?Subjective:  ?Patient ID: Amy Schroeder, female    DOB: Mar 02, 1981  Age: 41 y.o. MRN: [id number] ? ?CC:  ?Chief Complaint  ?Patient presents with  ? Follow-up  ?  6 month follow up, back pain,depressed.  ? ? ?HPI ?Amy Schroeder is a 41 y.o. female with past medical history of anxiety, ADHD, opioid dependence in remission, chronic low back pain and PTSD who presents for f/u of her chronic medical conditions. ? ?She has been having spells of anxiety since her Xanax frequency was recently decreased by her psychiatrist.  She states that her psychiatrist is asking about her use of buprenorphine, which she has been taking for opioid dependence.  She is confused about requesting Xanax from her Psychiatrist and is not able to explain her request today to [place] as well. Denies SI or HI. Denies any substance abuse currently.  She states that her pain/addiction specialist has closed his practice, and she denies new referral to pain/addiction clinic currently.  She was also on Adderall for ADHD, which she has stopped taking. ? ?She complains of acute on chronic low back pain, which is worse with movement.  She also c/o radiating pain to the b/l LE with chronic numbness and tingling of the LE.  Denies any saddle anesthesia, urinary or stool incontinence currently.  Denies any recent fall or injury. ? ? ? ? ? ? ?Past Medical History:  ?Diagnosis Date  ? ADHD   ? Anxiety   ? Chronic back pain   ? Chronic neck pain   ? Chronic pain   ? Headache   ? ? ?Past Surgical History:  ?Procedure Laterality Date  ? TUBAL LIGATION    ? ? ?Family History  ?Problem Relation Age of Onset  ? Cancer Father   ?     ureter  ? Breast cancer Mother 74  ? Drug abuse Brother   ? ? ?Social History  ? ?Socioeconomic History  ? Marital status: Married  ?  Spouse name: Not on file  ? Number of children: Not on file  ? Years of education: Not on file  ? Highest education level: Not on file  ?Occupational History  ?  Not on file  ?Tobacco Use  ? Smoking status: Every Day  ?  Packs/day: 0.50  ?  Types: Cigarettes  ? Smokeless tobacco: Never  ?Vaping Use  ? Vaping Use: Never used  ?Substance and Sexual Activity  ? Alcohol use: No  ? Drug use: No  ? Sexual activity: Yes  ?  Birth control/protection: Surgical  ?  Comment: tubal  ?Other Topics Concern  ? Not on file  ?Social History Narrative  ? Not on file  ? ?Social Determinants of Health  ? ?Financial Resource Strain: Not on file  ?Food Insecurity: Not on file  ?Transportation Needs: Not on file  ?Physical Activity: Not on file  ?Stress: Not on file  ?Social Connections: Not on file  ?Intimate Partner Violence: Not on file  ? ? ?Outpatient Medications Prior to Visit  ?Medication Sig Dispense Refill  ? acetaminophen (TYLENOL) 500 MG tablet Take 500-650 mg by mouth daily as needed for mild pain or moderate pain.    ? ALPRAZolam (XANAX) 1 MG tablet Take 1 mg by mouth 4 (four) times daily. May take as needed    ? EPINEPHrine 0.3 mg/0.3 mL IJ SOAJ injection Inject 0.3 mg into the muscle as needed for anaphylaxis. 2 each 2  ? ibuprofen (ADVIL,MOTRIN) 200  MG tablet Take 400 mg by mouth daily as needed for mild pain or moderate pain.     ? Multiple Vitamin (MULTIVITAMIN WITH MINERALS) TABS tablet Take 1 tablet by mouth daily.    ? vitamin B-12 (CYANOCOBALAMIN) 500 MCG tablet Take 500 mcg by mouth daily.    ? amphetamine-dextroamphetamine (ADDERALL) 30 MG tablet Take 30 mg by mouth 2 (two) times daily. (Patient not taking: Reported on 08/18/2021)    ? buprenorphine (SUBUTEX) 8 MG SUBL SL tablet Place 8 mg under the tongue 3 (three) times daily. (Patient not taking: Reported on 08/18/2021)    ? UNABLE TO FIND Med Name: Gabapentin 6%/Lidocaine 5%/Cyclobenzaprine 2%. Apply topically for back pain relief. ?Day supply: 30 (Patient not taking: Reported on 08/18/2021) 60 g 0  ? ?No facility-administered medications prior to visit.  ? ? ?Allergies  ?Allergen Reactions  ? Neurontin [Gabapentin] Nausea  Only  ? Ultram [Tramadol Hcl] Other (See Comments)  ?  seizures  ? ? ?ROS ?Review of Systems  ?Constitutional:  Negative for chills and fever.  ?HENT:  Negative for congestion, sinus pressure, sinus pain and sore throat.   ?Eyes:  Negative for pain and discharge.  ?Respiratory:  Negative for cough and shortness of breath.   ?Cardiovascular:  Positive for leg swelling. Negative for chest pain and palpitations.  ?Gastrointestinal:  Negative for abdominal pain, constipation, diarrhea, nausea and vomiting.  ?Endocrine: Negative for polydipsia and polyuria.  ?Genitourinary:  Negative for dysuria and hematuria.  ?Musculoskeletal:  Positive for arthralgias and back pain. Negative for neck pain and neck stiffness.  ?Skin:  Negative for rash.  ?Neurological:  Positive for numbness. Negative for dizziness and weakness.  ?Psychiatric/Behavioral:  Positive for decreased concentration, dysphoric mood and sleep disturbance. Negative for agitation and behavioral problems. The patient is nervous/anxious.   ? ?  ?Objective:  ?  ?Physical Exam ?Vitals reviewed.  ?Constitutional:   ?   General: She is not in acute distress. ?   Appearance: She is not diaphoretic.  ?HENT:  ?   Head: Normocephalic and atraumatic.  ?   Nose: Nose normal.  ?   Mouth/Throat:  ?   Mouth: Mucous membranes are moist.  ?Eyes:  ?   General: No scleral icterus. ?   Extraocular Movements: Extraocular movements intact.  ?Cardiovascular:  ?   Rate and Rhythm: Normal rate and regular rhythm.  ?   Pulses: Normal pulses.  ?   Heart sounds: Normal heart sounds. No murmur heard. ?Pulmonary:  ?   Breath sounds: Normal breath sounds. No wheezing or rales.  ?Abdominal:  ?   Palpations: Abdomen is soft.  ?   Tenderness: There is no abdominal tenderness.  ?Musculoskeletal:     ?   General: Tenderness (Lumbar spine area) present.  ?   Cervical back: Neck supple. No tenderness.  ?   Right lower leg: No edema.  ?   Left lower leg: No edema.  ?   Comments: ASO brace in right  ankle  ?Skin: ?   General: Skin is warm.  ?   Findings: No rash.  ?Neurological:  ?   General: No focal deficit present.  ?   Mental Status: She is alert and oriented to person, place, and time.  ?   Sensory: No sensory deficit.  ?   Motor: No weakness.  ?Psychiatric:     ?   Mood and Affect: Mood is anxious and depressed. Affect is tearful.     ?   Speech:  Speech is delayed and tangential.     ?   Thought Content: Thought content does not include homicidal or suicidal ideation.  ? ? ?BP 110/75   Pulse (!) 59   Ht '5\' 11"'  (1.803 m)   Wt 257 lb 6.4 oz (116.8 kg)   SpO2 96%   BMI 35.90 kg/m?  ?Wt Readings from Last 3 Encounters:  ?08/18/21 257 lb 6.4 oz (116.8 kg)  ?05/18/21 240 lb 0.6 oz (108.9 kg)  ?01/12/21 234 lb 1.3 oz (106.2 kg)  ? ? ?Lab Results  ?Component Value Date  ? TSH 2.320 05/17/2021  ? ?Lab Results  ?Component Value Date  ? WBC 5.6 05/17/2021  ? HGB 13.3 05/17/2021  ? HCT 39.7 05/17/2021  ? MCV 91 05/17/2021  ? PLT 275 05/17/2021  ? ?Lab Results  ?Component Value Date  ? NA 140 05/17/2021  ? K 4.6 05/17/2021  ? CO2 21 05/17/2021  ? GLUCOSE 97 05/17/2021  ? BUN 9 05/17/2021  ? CREATININE 1.17 (H) 05/17/2021  ? BILITOT 0.5 05/17/2021  ? ALKPHOS 52 05/17/2021  ? AST 12 05/17/2021  ? ALT 9 05/17/2021  ? PROT 6.9 05/17/2021  ? ALBUMIN 4.4 05/17/2021  ? CALCIUM 9.6 05/17/2021  ? ANIONGAP 9 01/18/2019  ? EGFR 60 05/17/2021  ? ?Lab Results  ?Component Value Date  ? CHOL 174 05/17/2021  ? ?Lab Results  ?Component Value Date  ? HDL 42 05/17/2021  ? ?Lab Results  ?Component Value Date  ? LDLCALC 111 (H) 05/17/2021  ? ?Lab Results  ?Component Value Date  ? TRIG 117 05/17/2021  ? ?Lab Results  ?Component Value Date  ? CHOLHDL 4.1 05/17/2021  ? ?Lab Results  ?Component Value Date  ? HGBA1C 5.7 (H) 05/17/2021  ? ? ?  ?Assessment & Plan:  ? ?Problem List Items Addressed This Visit   ? ?  ? Nervous and Auditory  ? Lumbar radiculopathy  ?  History of chronic low back pain ?Has been having radicular symptoms and  recurrent falls, check MRI of lumbar spine ?Has seen spine surgery - advised to continue pain management ?Toradol and Depo-Medrol in the office today ?Started Sterapred taper, advised to avoid oral NSAIDs while t

## 2021-08-18 NOTE — Patient Instructions (Signed)
Please take Prednisone as prescribed. Avoid taking Ibuprofen or Naproxen while taking Prednisone. ? ?Please contact your Psychiatrist about Xanax and antidepressant. ?

## 2021-08-18 NOTE — Assessment & Plan Note (Signed)
Was on Adderall ?Advised to stay compliant to her psychiatry regimen ?Follows up with Psychiatrist ?

## 2021-08-18 NOTE — Assessment & Plan Note (Signed)
History of chronic low back pain ?Has been having radicular symptoms and recurrent falls, check MRI of lumbar spine ?Has seen spine surgery - advised to continue pain management ?Toradol and Depo-Medrol in the office today ?Started Sterapred taper, advised to avoid oral NSAIDs while taking steroids ?

## 2021-09-21 ENCOUNTER — Encounter: Payer: Medicare Other | Admitting: Internal Medicine

## 2021-10-05 ENCOUNTER — Telehealth: Payer: Medicare Other | Admitting: Internal Medicine

## 2021-10-12 ENCOUNTER — Encounter: Payer: Self-pay | Admitting: Internal Medicine

## 2021-10-12 ENCOUNTER — Telehealth: Payer: Self-pay | Admitting: Internal Medicine

## 2021-10-12 ENCOUNTER — Ambulatory Visit: Payer: Medicare Other | Admitting: Internal Medicine

## 2021-10-12 NOTE — Telephone Encounter (Signed)
Dismiss letter mailed and my chart sent 10/12/21

## 2021-11-16 ENCOUNTER — Ambulatory Visit: Payer: BC Managed Care – PPO | Admitting: Internal Medicine

## 2021-12-19 DIAGNOSIS — M25579 Pain in unspecified ankle and joints of unspecified foot: Secondary | ICD-10-CM | POA: Diagnosis not present

## 2021-12-19 DIAGNOSIS — M5459 Other low back pain: Secondary | ICD-10-CM | POA: Diagnosis not present

## 2022-01-02 DIAGNOSIS — Z0000 Encounter for general adult medical examination without abnormal findings: Secondary | ICD-10-CM | POA: Diagnosis not present

## 2022-01-04 DIAGNOSIS — M25571 Pain in right ankle and joints of right foot: Secondary | ICD-10-CM | POA: Diagnosis not present

## 2022-01-17 DIAGNOSIS — M19071 Primary osteoarthritis, right ankle and foot: Secondary | ICD-10-CM | POA: Diagnosis not present

## 2022-01-17 DIAGNOSIS — S86311A Strain of muscle(s) and tendon(s) of peroneal muscle group at lower leg level, right leg, initial encounter: Secondary | ICD-10-CM | POA: Diagnosis not present

## 2022-01-17 DIAGNOSIS — M85671 Other cyst of bone, right ankle and foot: Secondary | ICD-10-CM | POA: Diagnosis not present

## 2022-01-23 DIAGNOSIS — M25571 Pain in right ankle and joints of right foot: Secondary | ICD-10-CM | POA: Diagnosis not present

## 2022-02-17 ENCOUNTER — Ambulatory Visit: Payer: Medicare Other | Admitting: Internal Medicine

## 2022-04-13 ENCOUNTER — Encounter: Payer: Self-pay | Admitting: Emergency Medicine

## 2022-04-13 ENCOUNTER — Ambulatory Visit
Admission: EM | Admit: 2022-04-13 | Discharge: 2022-04-13 | Disposition: A | Discharge status: Home / Self Care | Payer: Medicare Other | Attending: Nurse Practitioner | Admitting: Nurse Practitioner

## 2022-04-13 DIAGNOSIS — J019 Acute sinusitis, unspecified: Secondary | ICD-10-CM

## 2022-04-13 DIAGNOSIS — B9689 Other specified bacterial agents as the cause of diseases classified elsewhere: Secondary | ICD-10-CM

## 2022-04-13 MED ORDER — PROMETHAZINE-DM 6.25-15 MG/5ML PO SYRP: 5.0000 mL | ORAL_SOLUTION | Freq: Every evening | ORAL | 0 refills | Status: DC | PRN

## 2022-04-13 MED ORDER — AMOXICILLIN-POT CLAVULANATE 875-125 MG PO TABS: 1.0000 | ORAL_TABLET | Freq: Two times a day (BID) | ORAL | 0 refills | Status: AC

## 2022-04-13 MED ORDER — BENZONATATE 100 MG PO CAPS: 100.0000 mg | ORAL_CAPSULE | Freq: Three times a day (TID) | ORAL | 0 refills | Status: DC | PRN

## 2022-04-13 NOTE — ED Triage Notes (Signed)
Sinus congestion x 3 weeks with Sneezing and cough.  Has tried coricidin without relief.  States has been using a netti pot.  States throat is now irritated.

## 2022-04-13 NOTE — Discharge Instructions (Signed)
You have a bacterial sign this infection.  Please take the Augmentin and take it twice daily for 7 days.  Continue the nasal saline rinses, Nettie pot, and humidifier.  For the cough, you can take Tessalon Perles during the day every 8 hours as needed for dry cough.  At nighttime, you can use a cough syrup.  Recommend continuing Coricidin or guaifenesin 600 mg twice daily, steam showers, other supportive care.  If symptoms persist or worsen despite treatment, follow-up with primary care provider.

## 2022-04-13 NOTE — ED Provider Notes (Signed)
[facility]    CSN: [id number] Arrival date & time: 04/13/22  1439      History   Chief Complaint No chief complaint on file.   HPI Amy Schroeder is a 41 y.o. female.   Patient presents for more than 3 weeks of congested cough, chest and nasal congestion, sore throat, headache, sinus pressure, ear pain without drainage, and fatigue.  She denies fever, shortness of breath or chest pain, runny nose, postnasal drainage, ear pressure, nausea/vomiting, diarrhea, abdominal pain, decreased appetite, loss of taste or smell, or new rash.  Has been taking over-the-counter Coricidin, nasal saline rinses, Nettie pot, and running a humidifier which has helped minimally.    Past Medical History:  Diagnosis Date   ADHD    Anxiety    Chronic back pain    Chronic neck pain    Chronic pain    Headache     Patient Active Problem List   Diagnosis Date Noted   Moderate episode of recurrent major depressive disorder ([place]) 08/18/2021   Encounter for general adult medical examination with abnormal findings 05/18/2021   Prediabetes 05/18/2021   Mixed hyperlipidemia 05/18/2021   Vitamin D deficiency 05/18/2021   Positive hepatitis C antibody test 05/18/2021   Chronic pain of right ankle 05/18/2021   Anaphylactic syndrome 01/12/2021   Lumbar radiculopathy 01/12/2021   Idiopathic peripheral neuropathy 01/12/2021   Anxiety 05/20/2020   Attention deficit hyperactivity disorder (ADHD) 05/20/2020   Opioid dependence in remission ([place]) 05/20/2020   Family history of breast cancer in female 05/20/2020    Past Surgical History:  Procedure Laterality Date   TUBAL LIGATION      OB History     Gravida  3   Para  3   Term      Preterm      AB      Living  3      SAB      IAB      Ectopic      Multiple      Live Births  3            Home Medications    Prior to Admission medications   Medication Sig Start Date End Date Taking? Authorizing Provider   amoxicillin-clavulanate (AUGMENTIN) 875-125 MG tablet Take 1 tablet by mouth 2 (two) times daily for 7 days. 04/13/22 04/20/22 Yes Eulogio Bear, NP  benzonatate (TESSALON) 100 MG capsule Take 1 capsule (100 mg total) by mouth 3 (three) times daily as needed for cough. Do not take with alcohol or while driving or operating heavy machinery.  May cause drowsiness. 04/13/22  Yes Eulogio Bear, NP  promethazine-dextromethorphan (PROMETHAZINE-DM) 6.25-15 MG/5ML syrup Take 5 mLs by mouth at bedtime as needed for cough. Do not take with alcohol or while driving or operating heavy machinery.  May cause drowsiness. 04/13/22  Yes Eulogio Bear, NP  acetaminophen (TYLENOL) 500 MG tablet Take 500-650 mg by mouth daily as needed for mild pain or moderate pain.    [provider]  ALPRAZolam Duanne Moron) 1 MG tablet Take 1 mg by mouth 4 (four) times daily. May take as needed    [provider]  amphetamine-dextroamphetamine (ADDERALL) 30 MG tablet Take 30 mg by mouth 2 (two) times daily. Patient not taking: Reported on 08/18/2021    [provider]  buprenorphine (SUBUTEX) 8 MG SUBL SL tablet Place 8 mg under the tongue 3 (three) times daily. Patient not taking: Reported on 08/18/2021  [provider]  EPINEPHrine 0.3 mg/0.3 mL IJ SOAJ injection Inject 0.3 mg into the muscle as needed for anaphylaxis. 01/12/21   Anabel Halon, MD  ibuprofen (ADVIL,MOTRIN) 200 MG tablet Take 400 mg by mouth daily as needed for mild pain or moderate pain.     [provider]  Multiple Vitamin (MULTIVITAMIN WITH MINERALS) TABS tablet Take 1 tablet by mouth daily.    [provider]  predniSONE (STERAPRED UNI-PAK 21 TAB) 10 MG (21) TBPK tablet Take as package instructions. 08/18/21   Anabel Halon, MD  UNABLE TO FIND Med Name: Gabapentin 6%/Lidocaine 5%/Cyclobenzaprine 2%. Apply topically for back pain relief. Day supply: 30 Patient not taking: Reported on 08/18/2021  01/12/21   Anabel Halon, MD  vitamin B-12 (CYANOCOBALAMIN) 500 MCG tablet Take 500 mcg by mouth daily.    [provider]    Family History Family History  Problem Relation Age of Onset   Cancer Father        ureter   Breast cancer Mother 33   Drug abuse Brother     Social History Social History   Tobacco Use   Smoking status: Every Day    Packs/day: 0.50    Types: Cigarettes   Smokeless tobacco: Never  Vaping Use   Vaping Use: Never used  Substance Use Topics   Alcohol use: No   Drug use: No     Allergies   Neurontin [gabapentin] and Ultram [tramadol hcl]   Review of Systems Review of Systems Per HPI  Physical Exam Triage Vital Signs ED Triage Vitals  Enc Vitals Group     BP 04/13/22 1517 121/82     Pulse Rate 04/13/22 1517 79     Resp 04/13/22 1517 18     Temp 04/13/22 1517 98.1 F (36.7 C)     Temp Source 04/13/22 1517 Oral     SpO2 04/13/22 1517 97 %     Weight --      Height --      Head Circumference --      Peak Flow --      Pain Score 04/13/22 1519 7     Pain Loc --      Pain Edu? --      Excl. in GC? --    No data found.  Updated Vital Signs BP 121/82 (BP Location: Right Arm)   Pulse 79   Temp 98.1 F (36.7 C) (Oral)   Resp 18   LMP 03/25/2022 (Exact Date)   SpO2 97%   Visual Acuity Right Eye Distance:   Left Eye Distance:   Bilateral Distance:    Right Eye Near:   Left Eye Near:    Bilateral Near:     Physical Exam Vitals and nursing note reviewed.  Constitutional:      General: She is not in acute distress.    Appearance: Normal appearance. She is not ill-appearing or toxic-appearing.  HENT:     Head: Normocephalic and atraumatic.     Right Ear: Tympanic membrane, ear canal and external ear normal.     Left Ear: Tympanic membrane, ear canal and external ear normal.     Nose: Congestion and rhinorrhea present.     Right Sinus: Maxillary sinus tenderness present. No frontal sinus tenderness.     Left Sinus:  Maxillary sinus tenderness present. No frontal sinus tenderness.     Mouth/Throat:     Mouth: Mucous membranes are moist.     Pharynx:  Oropharynx is clear. Posterior oropharyngeal erythema present. No oropharyngeal exudate.  Eyes:     General: No scleral icterus.    Extraocular Movements: Extraocular movements intact.  Cardiovascular:     Rate and Rhythm: Normal rate and regular rhythm.  Pulmonary:     Effort: Pulmonary effort is normal. No respiratory distress.     Breath sounds: Normal breath sounds. No wheezing, rhonchi or rales.  Abdominal:     General: Abdomen is flat. Bowel sounds are normal. There is no distension.     Palpations: Abdomen is soft.  Musculoskeletal:     Cervical back: Normal range of motion and neck supple.  Lymphadenopathy:     Cervical: No cervical adenopathy.  Skin:    General: Skin is warm and dry.     Capillary Refill: Capillary refill takes less than 2 seconds.     Coloration: Skin is not jaundiced or pale.     Findings: No erythema or rash.  Neurological:     Mental Status: She is alert and oriented to person, place, and time.  Psychiatric:        Behavior: Behavior is cooperative.      UC Treatments / Results  Labs (all labs ordered are listed, but only abnormal results are displayed) Labs Reviewed - No data to display  EKG   Radiology No results found.  Procedures Procedures (including critical care time)  Medications Ordered in UC Medications - No data to display  Initial Impression / Assessment and Plan / UC Course  I have reviewed the triage vital signs and the nursing notes.  Pertinent labs & imaging results that were available during my care of the patient were reviewed by me and considered in my medical decision making (see chart for details).   Patient is well-appearing, normotensive, afebrile, not tachycardic, not tachypneic, oxygenating well on room air.    Acute bacterial sinusitis Treat with Augmentin twice daily for  7 days Supportive care discussed with patient Start cough suppressants as needed ER and return precautions discussed  The patient was given the opportunity to ask questions.  All questions answered to their satisfaction.  The patient is in agreement to this plan.    Final Clinical Impressions(s) / UC Diagnoses   Final diagnoses:  Acute bacterial sinusitis     Discharge Instructions      You have a bacterial sign this infection.  Please take the Augmentin and take it twice daily for 7 days.  Continue the nasal saline rinses, Nettie pot, and humidifier.  For the cough, you can take Tessalon Perles during the day every 8 hours as needed for dry cough.  At nighttime, you can use a cough syrup.  Recommend continuing Coricidin or guaifenesin 600 mg twice daily, steam showers, other supportive care.  If symptoms persist or worsen despite treatment, follow-up with primary care provider.     ED Prescriptions     Medication Sig Dispense Auth. Provider   amoxicillin-clavulanate (AUGMENTIN) 875-125 MG tablet Take 1 tablet by mouth 2 (two) times daily for 7 days. 14 tablet Noemi Chapel A, NP   promethazine-dextromethorphan (PROMETHAZINE-DM) 6.25-15 MG/5ML syrup Take 5 mLs by mouth at bedtime as needed for cough. Do not take with alcohol or while driving or operating heavy machinery.  May cause drowsiness. 118 mL Noemi Chapel A, NP   benzonatate (TESSALON) 100 MG capsule Take 1 capsule (100 mg total) by mouth 3 (three) times daily as needed for cough. Do not take with alcohol or while  driving or operating heavy machinery.  May cause drowsiness. 21 capsule Eulogio Bear, NP      I have reviewed the PDMP during this encounter.   Eulogio Bear, NP 04/13/22 [phone]

## 2022-05-15 ENCOUNTER — Other Ambulatory Visit: Payer: Self-pay | Admitting: Orthopaedic Surgery

## 2022-05-15 DIAGNOSIS — M25571 Pain in right ankle and joints of right foot: Secondary | ICD-10-CM

## 2022-07-05 ENCOUNTER — Ambulatory Visit: Payer: Medicare Other | Admitting: Family Medicine

## 2022-08-23 ENCOUNTER — Ambulatory Visit (INDEPENDENT_AMBULATORY_CARE_PROVIDER_SITE_OTHER): Payer: Medicare Other | Admitting: Family Medicine

## 2022-08-23 ENCOUNTER — Encounter: Payer: Self-pay | Admitting: Family Medicine

## 2022-08-23 VITALS — BP 119/82 | HR 82 | Temp 98.2°F | Ht 71.0 in | Wt 240.0 lb

## 2022-08-23 DIAGNOSIS — E782 Mixed hyperlipidemia: Secondary | ICD-10-CM | POA: Diagnosis not present

## 2022-08-23 DIAGNOSIS — E559 Vitamin D deficiency, unspecified: Secondary | ICD-10-CM | POA: Diagnosis not present

## 2022-08-23 DIAGNOSIS — E669 Obesity, unspecified: Secondary | ICD-10-CM

## 2022-08-23 DIAGNOSIS — N921 Excessive and frequent menstruation with irregular cycle: Secondary | ICD-10-CM | POA: Diagnosis not present

## 2022-08-23 DIAGNOSIS — R7303 Prediabetes: Secondary | ICD-10-CM | POA: Diagnosis not present

## 2022-08-23 DIAGNOSIS — E538 Deficiency of other specified B group vitamins: Secondary | ICD-10-CM | POA: Diagnosis not present

## 2022-08-23 DIAGNOSIS — R635 Abnormal weight gain: Secondary | ICD-10-CM

## 2022-08-23 NOTE — Patient Instructions (Signed)
Labs at [facility] when you like.  We will call with the results.  Follow up annually.  Be sure to see Ob-Gyn.

## 2022-08-24 LAB — TSH: TSH: 0.297 u[IU]/mL — ABNORMAL LOW (ref 0.450–4.500)

## 2022-08-24 LAB — CBC
Hematocrit: 41.3 % (ref 34.0–46.6)
Hemoglobin: 13.3 g/dL (ref 11.1–15.9)
MCH: 29 pg (ref 26.6–33.0)
MCHC: 32.2 g/dL (ref 31.5–35.7)
MCV: 90 fL (ref 79–97)
Platelets: 320 10*3/uL (ref 150–450)
RBC: 4.58 x10E6/uL (ref 3.77–5.28)
RDW: 13.2 % (ref 11.7–15.4)
WBC: 10 10*3/uL (ref 3.4–10.8)

## 2022-08-24 LAB — CMP14+EGFR
ALT: 22 IU/L (ref 0–32)
AST: 12 IU/L (ref 0–40)
Albumin/Globulin Ratio: 1.8 (ref 1.2–2.2)
Albumin: 4.7 g/dL (ref 3.9–4.9)
Alkaline Phosphatase: 56 IU/L (ref 44–121)
BUN/Creatinine Ratio: 8 — ABNORMAL LOW (ref 9–23)
BUN: 10 mg/dL (ref 6–24)
Bilirubin Total: 0.3 mg/dL (ref 0.0–1.2)
CO2: 20 mmol/L (ref 20–29)
Calcium: 9.8 mg/dL (ref 8.7–10.2)
Chloride: 105 mmol/L (ref 96–106)
Creatinine, Ser: 1.18 mg/dL — ABNORMAL HIGH (ref 0.57–1.00)
Globulin, Total: 2.6 g/dL (ref 1.5–4.5)
Glucose: 101 mg/dL — ABNORMAL HIGH (ref 70–99)
Potassium: 4.9 mmol/L (ref 3.5–5.2)
Sodium: 141 mmol/L (ref 134–144)
Total Protein: 7.3 g/dL (ref 6.0–8.5)
eGFR: 60 mL/min/{1.73_m2} (ref >59)

## 2022-08-24 LAB — LIPID PANEL
Chol/HDL Ratio: 4.7 ratio — ABNORMAL HIGH (ref 0.0–4.4)
Cholesterol, Total: 197 mg/dL (ref 100–199)
HDL: 42 mg/dL (ref >39)
LDL Chol Calc (NIH): 129 mg/dL — ABNORMAL HIGH (ref 0–99)
Triglycerides: 144 mg/dL (ref 0–149)
VLDL Cholesterol Cal: 26 mg/dL (ref 5–40)

## 2022-08-24 LAB — HEMOGLOBIN A1C
Est. average glucose Bld gHb Est-mCnc: 123 mg/dL
Hgb A1c MFr Bld: 5.9 % — ABNORMAL HIGH (ref 4.8–5.6)

## 2022-08-24 LAB — VITAMIN B12: Vitamin B-12: 288 pg/mL (ref 232–1245)

## 2022-08-24 LAB — VITAMIN D 25 HYDROXY (VIT D DEFICIENCY, FRACTURES): Vit D, 25-Hydroxy: 22.9 ng/mL — ABNORMAL LOW (ref 30.0–100.0)

## 2022-08-25 NOTE — Progress Notes (Unsigned)
Subjective:  Patient ID: Amy Schroeder, female    DOB: 1980-11-22  Age: 42 y.o. MRN: [id number]  CC: Chief Complaint  Patient presents with   Establish Care    Perimenopausal symptoms - heavy menstrual bleeding every other week, hot flashes, emotional swings, hx anemia about a year     HPI:  42 year old female presents to establish care.   Patient reports frequent menstrual cycles, hot flashes, and mood swings. She also reports post coital bleeding. Patient reports that she will be seeing Ob Gyn in the near future. No other reported symptoms. No other complaints at this time.  Patient Active Problem List   Diagnosis Date Noted   Metrorrhagia 08/26/2022   Moderate episode of recurrent major depressive disorder 08/18/2021   Prediabetes 05/18/2021   Mixed hyperlipidemia 05/18/2021   Vitamin D deficiency 05/18/2021   Positive hepatitis C antibody test 05/18/2021   Anaphylactic syndrome 01/12/2021   Anxiety 05/20/2020   Attention deficit hyperactivity disorder (ADHD) 05/20/2020   Opioid dependence in remission 05/20/2020    Social Hx   Social History   Socioeconomic History   Marital status: Married    Spouse name: Not on file   Number of children: Not on file   Years of education: Not on file   Highest education level: Not on file  Occupational History   Not on file  Tobacco Use   Smoking status: Every Day    Packs/day: .5    Types: Cigarettes   Smokeless tobacco: Never  Vaping Use   Vaping Use: Never used  Substance and Sexual Activity   Alcohol use: No   Drug use: No   Sexual activity: Yes    Birth control/protection: Surgical    Comment: tubal  Other Topics Concern   Not on file  Social History Narrative   Not on file   Social Determinants of Health   Financial Resource Strain: Low Risk  (01/22/2020)   Overall Financial Resource Strain (CARDIA)    Difficulty of Paying Living Expenses: Not hard at all  Food Insecurity: No Food Insecurity (01/22/2020)    Hunger Vital Sign    Worried About Running Out of Food in the Last Year: Never true    Ran Out of Food in the Last Year: Never true  Transportation Needs: No Transportation Needs (01/22/2020)   PRAPARE - Administrator, Civil Service (Medical): No    Lack of Transportation (Non-Medical): No  Physical Activity: Inactive (01/22/2020)   Exercise Vital Sign    Days of Exercise per Week: 0 days    Minutes of Exercise per Session: 0 min  Stress: No Stress Concern Present (01/22/2020)   [employer] of Occupational Health - Occupational Stress Questionnaire    Feeling of Stress : Not at all  Social Connections: Socially Integrated (01/22/2020)   Social Connection and Isolation Panel [NHANES]    Frequency of Communication with Friends and Family: Twice a week    Frequency of Social Gatherings with Friends and Family: Once a week    Attends Religious Services: More than 4 times per year    Active Member of [employer] or Organizations: Yes    Attends Engineer, structural: More than 4 times per year    Marital Status: Married    Review of Systems Per HPI  Objective:  BP 119/82   Pulse 82   Temp 98.2 F (36.8 C)   Ht  (1.803 m)   Wt 240 lb (108.9 kg)  LMP 08/17/2022 (Exact Date)   SpO2 98%   BMI 33.47 kg/m      08/23/2022    2:31 PM 04/13/2022    3:17 PM 08/18/2021    8:46 AM  BP/Weight  Systolic BP 119 121 110  Diastolic BP 82 82 75  Wt. (Lbs) 240  257.4  BMI 33.47 kg/m2  35.9 kg/m2    Physical Exam Constitutional:      General: She is not in acute distress.    Appearance: Normal appearance.  HENT:     Head: Normocephalic and atraumatic.  Eyes:     General:        Right eye: No discharge.        Left eye: No discharge.     Conjunctiva/sclera: Conjunctivae normal.  Cardiovascular:     Rate and Rhythm: Normal rate and regular rhythm.  Pulmonary:     Effort: Pulmonary effort is normal.     Breath sounds: Normal breath sounds. No wheezing,  rhonchi or rales.  Abdominal:     General: There is no distension.     Palpations: Abdomen is soft.     Tenderness: There is no abdominal tenderness.  Neurological:     Mental Status: She is alert.  Psychiatric:        Mood and Affect: Mood normal.        Behavior: Behavior normal.     Lab Results  Component Value Date   WBC 10.0 08/23/2022   HGB 13.3 08/23/2022   HCT 41.3 08/23/2022   PLT 320 08/23/2022   GLUCOSE 101 (H) 08/23/2022   CHOL 197 08/23/2022   TRIG 144 08/23/2022   HDL 42 08/23/2022   LDLCALC 129 (H) 08/23/2022   ALT 22 08/23/2022   AST 12 08/23/2022   NA 141 08/23/2022   K 4.9 08/23/2022   CL 105 08/23/2022   CREATININE 1.18 (H) 08/23/2022   BUN 10 08/23/2022   CO2 20 08/23/2022   TSH 0.297 (L) 08/23/2022   HGBA1C 5.9 (H) 08/23/2022     Assessment & Plan:   Problem List Items Addressed This Visit       Other   Vitamin D deficiency   Relevant Orders   Vitamin D, 25-hydroxy (Completed)   Mixed hyperlipidemia   Relevant Orders   Lipid panel (Completed)   Metrorrhagia - Primary    Labs today. Will need [place]. Defer to Ob-Gyn as they can do this in office.      Relevant Orders   CBC (Completed)   Other Visit Diagnoses     B12 deficiency       Relevant Orders   Vitamin B12 (Completed)   Weight gain       Relevant Orders   TSH (Completed)   CMP14+EGFR (Completed)   Obesity (BMI 30-39.9)       Relevant Orders   Hemoglobin A1c (Completed)   CMP14+EGFR (Completed)      Follow-up:  Return in about 1 year (around 08/23/2023).  Everlene Other DO [facility]

## 2022-08-26 DIAGNOSIS — N921 Excessive and frequent menstruation with irregular cycle: Secondary | ICD-10-CM | POA: Insufficient documentation

## 2022-08-26 NOTE — Assessment & Plan Note (Signed)
Labs today. Will need [place]. Defer to Ob-Gyn as they can do this in office.

## 2022-09-14 DIAGNOSIS — M5459 Other low back pain: Secondary | ICD-10-CM | POA: Diagnosis not present

## 2022-09-14 DIAGNOSIS — Z0000 Encounter for general adult medical examination without abnormal findings: Secondary | ICD-10-CM | POA: Diagnosis not present

## 2022-09-20 ENCOUNTER — Other Ambulatory Visit: Payer: Self-pay

## 2022-09-21 ENCOUNTER — Ambulatory Visit (INDEPENDENT_AMBULATORY_CARE_PROVIDER_SITE_OTHER): Payer: Medicare Other | Admitting: Adult Health

## 2022-09-21 ENCOUNTER — Encounter: Payer: Self-pay | Admitting: Adult Health

## 2022-09-21 ENCOUNTER — Other Ambulatory Visit (HOSPITAL_COMMUNITY)
Admission: RE | Admit: 2022-09-21 | Discharge: 2022-09-21 | Disposition: A | Discharge status: Home / Self Care | Payer: Medicare Other | Source: Ambulatory Visit | Attending: Adult Health | Admitting: Adult Health

## 2022-09-21 VITALS — BP 137/85 | HR 66 | Ht 71.0 in | Wt 246.0 lb

## 2022-09-21 DIAGNOSIS — Z01419 Encounter for gynecological examination (general) (routine) without abnormal findings: Secondary | ICD-10-CM | POA: Insufficient documentation

## 2022-09-21 DIAGNOSIS — R0683 Snoring: Secondary | ICD-10-CM | POA: Diagnosis not present

## 2022-09-21 DIAGNOSIS — R4589 Other symptoms and signs involving emotional state: Secondary | ICD-10-CM | POA: Diagnosis not present

## 2022-09-21 DIAGNOSIS — Z124 Encounter for screening for malignant neoplasm of cervix: Secondary | ICD-10-CM | POA: Insufficient documentation

## 2022-09-21 DIAGNOSIS — R7989 Other specified abnormal findings of blood chemistry: Secondary | ICD-10-CM | POA: Diagnosis not present

## 2022-09-21 DIAGNOSIS — N926 Irregular menstruation, unspecified: Secondary | ICD-10-CM

## 2022-09-21 DIAGNOSIS — N951 Menopausal and female climacteric states: Secondary | ICD-10-CM

## 2022-09-21 DIAGNOSIS — Z3202 Encounter for pregnancy test, result negative: Secondary | ICD-10-CM

## 2022-09-21 DIAGNOSIS — Z1151 Encounter for screening for human papillomavirus (HPV): Secondary | ICD-10-CM | POA: Diagnosis not present

## 2022-09-21 DIAGNOSIS — R232 Flushing: Secondary | ICD-10-CM | POA: Diagnosis not present

## 2022-09-21 DIAGNOSIS — R635 Abnormal weight gain: Secondary | ICD-10-CM

## 2022-09-21 LAB — POCT URINE PREGNANCY: Preg Test, Ur: NEGATIVE

## 2022-09-21 NOTE — Progress Notes (Signed)
  Subjective:     Patient ID: Amy Schroeder, female   DOB: [birth year], 42 y.o.   MRN: [id number]  HPI Amy Schroeder is a 42 year old white female,married, G3P3 in complaining of irregular periods, hot flashes, night sweats, moody, not sleeping well and husband says she snores and stops breathing. And she wants there pap today. She had physical with Dr Olena Leatherwood 09/14/22.   PCP is Dr Adriana Simas  Review of Systems +irregular periods + hot flashes +night sweats +moody +teary + not sleeping well and husband says she snores and stops breathing.  +weight gain, about 45 lbs in last 2 years   Has swelling in BLE at times Reviewed past medical,surgical, social and family history. Reviewed medications and allergies.  Objective:   Physical Exam BP 137/85 (BP Location: Left Arm, Patient Position: Sitting, Cuff Size: Large)   Pulse 66   Ht 5\' 11"  (1.803 m)   Wt 246 lb (111.6 kg)   LMP 09/15/2022 (Exact Date)   BMI 34.31 kg/m  UPT is negative  Skin warm and dry. Lungs: clear to ausculation bilaterally. Cardiovascular: regular rate and rhythm.    Pelvic: external genitalia is normal in appearance no lesions, vagina: pink,urethra has no lesions or masses noted, cervix:smooth and bulbous,pap with HR HPV performed, friable with EC brush, uterus: normal size, shape and contour, non tender, no masses felt, adnexa: no masses or tenderness noted. Bladder is non tender and no masses felt.  Fall risk is moderate  [facility] - 09/21/22 1212       Pregnancy Intention Screening   Does the patient want to become pregnant in the next year? No    Does the patient's partner want to become pregnant in the next year? No    Would the patient like to discuss contraceptive options today? No      Contraception Wrap Up   Current Method Female Sterilization    End Method Female Sterilization    Contraception Counseling Provided No            Examination chaperoned by Federico Flake CMA  Assessment:     1. Pregnancy  examination or test, negative result - POCT urine pregnancy  2. Routine Papanicolaou smear Pap sent Pap in 3 years if normal - Cytology - PAP( [place])  3. Irregular periods Periods not regular Will check FSH but she is aware it can fluctuate  - Follicle stimulating hormone  4. Hot flashes Will check labs - Follicle stimulating hormone - TSH + free T4 - T3, free  5. [facility] and teary at times   6. Weight gain +weight gain about 45 lbs in last 2 years  - TSH + free T4 - T3, free  7. Perimenopause Discussed symptoms are of perimenopause Review handout   8. Snores +snores will refer for sleep study  - Ambulatory referral to Sleep Studies  9. Elevated serum creatinine - Comprehensive metabolic panel     Plan:     Follow up TBD

## 2022-09-22 LAB — COMPREHENSIVE METABOLIC PANEL
ALT: 18 IU/L (ref 0–32)
AST: 16 IU/L (ref 0–40)
Albumin/Globulin Ratio: 1.9 (ref 1.2–2.2)
Albumin: 4.5 g/dL (ref 3.9–4.9)
Alkaline Phosphatase: 66 IU/L (ref 44–121)
BUN/Creatinine Ratio: 8 — ABNORMAL LOW (ref 9–23)
BUN: 8 mg/dL (ref 6–24)
Bilirubin Total: 0.3 mg/dL (ref 0.0–1.2)
CO2: 20 mmol/L (ref 20–29)
Calcium: 9.6 mg/dL (ref 8.7–10.2)
Chloride: 106 mmol/L (ref 96–106)
Creatinine, Ser: 1.05 mg/dL — ABNORMAL HIGH (ref 0.57–1.00)
Globulin, Total: 2.4 g/dL (ref 1.5–4.5)
Glucose: 92 mg/dL (ref 70–99)
Potassium: 4.5 mmol/L (ref 3.5–5.2)
Sodium: 142 mmol/L (ref 134–144)
Total Protein: 6.9 g/dL (ref 6.0–8.5)
eGFR: 68 mL/min/{1.73_m2} (ref >59)

## 2022-09-22 LAB — TSH+FREE T4
Free T4: 0.96 ng/dL (ref 0.82–1.77)
TSH: 0.715 u[IU]/mL (ref 0.450–4.500)

## 2022-09-22 LAB — FOLLICLE STIMULATING HORMONE: FSH: 6.7 m[IU]/mL

## 2022-09-22 LAB — T3, FREE: T3, Free: 2.2 pg/mL (ref 2.0–4.4)

## 2022-09-26 LAB — CYTOLOGY - PAP
Chlamydia: NEGATIVE
Comment: NEGATIVE
Comment: NEGATIVE
Comment: NORMAL
Diagnosis: UNDETERMINED — AB
High risk HPV: NEGATIVE
Neisseria Gonorrhea: NEGATIVE

## 2022-09-27 ENCOUNTER — Telehealth: Payer: Self-pay | Admitting: Adult Health

## 2022-09-27 ENCOUNTER — Encounter: Payer: Self-pay | Admitting: Adult Health

## 2022-09-27 DIAGNOSIS — R87610 Atypical squamous cells of undetermined significance on cytologic smear of cervix (ASC-US): Secondary | ICD-10-CM | POA: Insufficient documentation

## 2022-09-27 NOTE — Telephone Encounter (Signed)
Pt saw lab results on Mychart. Once JAG sees pap results, she will get a Mychart message. Pt voiced understanding. JSY

## 2022-09-27 NOTE — Telephone Encounter (Signed)
Patient called this morning that she doesn't have access to get in her mychart at this time and wants someone to call her about her results.

## 2022-10-19 DIAGNOSIS — M5441 Lumbago with sciatica, right side: Secondary | ICD-10-CM | POA: Diagnosis not present

## 2022-10-27 ENCOUNTER — Ambulatory Visit: Payer: Medicare Other

## 2023-01-16 ENCOUNTER — Ambulatory Visit: Payer: Medicare Other | Admitting: Family Medicine

## 2023-02-01 DIAGNOSIS — M5459 Other low back pain: Secondary | ICD-10-CM | POA: Diagnosis not present

## 2023-02-01 DIAGNOSIS — Z0000 Encounter for general adult medical examination without abnormal findings: Secondary | ICD-10-CM | POA: Diagnosis not present

## 2023-02-07 ENCOUNTER — Telehealth: Payer: Self-pay | Admitting: Family Medicine

## 2023-02-07 NOTE — Telephone Encounter (Signed)
Patient called states that her husband came home after his visit, Jonny Ruiz Clarida telling her that you were contacting their psychiatrist about her zanax. She states she didn't give him permission to discuss taking her off of it or sening request to her psychiatrists She states she has been on them since she was 13 and they do help.  CB# [phone].

## 2023-02-08 NOTE — Telephone Encounter (Signed)
Everlene Other G, DO    I did not discuss anything regarding her care or medications with him today.

## 2023-02-09 NOTE — Telephone Encounter (Signed)
Left message to return call 

## 2023-04-16 DIAGNOSIS — M5432 Sciatica, left side: Secondary | ICD-10-CM | POA: Diagnosis not present

## 2023-05-10 DIAGNOSIS — Z13220 Encounter for screening for lipoid disorders: Secondary | ICD-10-CM | POA: Diagnosis not present

## 2023-05-10 DIAGNOSIS — Z131 Encounter for screening for diabetes mellitus: Secondary | ICD-10-CM | POA: Diagnosis not present

## 2023-05-10 DIAGNOSIS — Z0000 Encounter for general adult medical examination without abnormal findings: Secondary | ICD-10-CM | POA: Diagnosis not present

## 2023-05-10 DIAGNOSIS — M5432 Sciatica, left side: Secondary | ICD-10-CM | POA: Diagnosis not present

## 2023-05-18 DIAGNOSIS — Z1231 Encounter for screening mammogram for malignant neoplasm of breast: Secondary | ICD-10-CM | POA: Diagnosis not present

## 2023-06-20 DIAGNOSIS — R92333 Mammographic heterogeneous density, bilateral breasts: Secondary | ICD-10-CM | POA: Diagnosis not present

## 2023-06-20 DIAGNOSIS — R928 Other abnormal and inconclusive findings on diagnostic imaging of breast: Secondary | ICD-10-CM | POA: Diagnosis not present

## 2023-06-20 DIAGNOSIS — N6310 Unspecified lump in the right breast, unspecified quadrant: Secondary | ICD-10-CM | POA: Diagnosis not present

## 2023-06-20 DIAGNOSIS — N6011 Diffuse cystic mastopathy of right breast: Secondary | ICD-10-CM | POA: Diagnosis not present

## 2023-07-30 DIAGNOSIS — N182 Chronic kidney disease, stage 2 (mild): Secondary | ICD-10-CM | POA: Diagnosis not present

## 2023-07-30 DIAGNOSIS — G4730 Sleep apnea, unspecified: Secondary | ICD-10-CM | POA: Diagnosis not present

## 2023-07-30 DIAGNOSIS — I872 Venous insufficiency (chronic) (peripheral): Secondary | ICD-10-CM | POA: Diagnosis not present

## 2023-08-21 DIAGNOSIS — M6281 Muscle weakness (generalized): Secondary | ICD-10-CM | POA: Diagnosis not present

## 2023-08-21 DIAGNOSIS — M545 Low back pain, unspecified: Secondary | ICD-10-CM | POA: Diagnosis not present

## 2023-09-04 DIAGNOSIS — M6281 Muscle weakness (generalized): Secondary | ICD-10-CM | POA: Diagnosis not present

## 2023-09-04 DIAGNOSIS — M545 Low back pain, unspecified: Secondary | ICD-10-CM | POA: Diagnosis not present

## 2023-09-06 DIAGNOSIS — M545 Low back pain, unspecified: Secondary | ICD-10-CM | POA: Diagnosis not present

## 2023-09-06 DIAGNOSIS — M6281 Muscle weakness (generalized): Secondary | ICD-10-CM | POA: Diagnosis not present

## 2023-09-13 DIAGNOSIS — M545 Low back pain, unspecified: Secondary | ICD-10-CM | POA: Diagnosis not present

## 2023-09-13 DIAGNOSIS — M6281 Muscle weakness (generalized): Secondary | ICD-10-CM | POA: Diagnosis not present

## 2023-09-18 DIAGNOSIS — M545 Low back pain, unspecified: Secondary | ICD-10-CM | POA: Diagnosis not present

## 2023-09-18 DIAGNOSIS — M6281 Muscle weakness (generalized): Secondary | ICD-10-CM | POA: Diagnosis not present

## 2023-12-04 DIAGNOSIS — F17210 Nicotine dependence, cigarettes, uncomplicated: Secondary | ICD-10-CM | POA: Diagnosis not present

## 2023-12-04 DIAGNOSIS — Z13220 Encounter for screening for lipoid disorders: Secondary | ICD-10-CM | POA: Diagnosis not present

## 2023-12-04 DIAGNOSIS — Z1329 Encounter for screening for other suspected endocrine disorder: Secondary | ICD-10-CM | POA: Diagnosis not present

## 2024-01-21 DIAGNOSIS — R609 Edema, unspecified: Secondary | ICD-10-CM | POA: Diagnosis not present

## 2024-02-04 DIAGNOSIS — R600 Localized edema: Secondary | ICD-10-CM | POA: Diagnosis not present

## 2024-02-04 DIAGNOSIS — M7989 Other specified soft tissue disorders: Secondary | ICD-10-CM | POA: Diagnosis not present

## 2024-02-25 DIAGNOSIS — Z23 Encounter for immunization: Secondary | ICD-10-CM | POA: Diagnosis not present

## 2024-02-25 DIAGNOSIS — F17210 Nicotine dependence, cigarettes, uncomplicated: Secondary | ICD-10-CM | POA: Diagnosis not present
